# Patient Record
Sex: Female | Born: 1945 | Race: White | Hispanic: No | Marital: Married | Smoking: Former smoker
Health system: Southern US, Community
[De-identification: ages and names within clinical notes are randomized; demographics above are authoritative.]

## PROBLEM LIST (undated history)

## (undated) DIAGNOSIS — Z8719 Personal history of other diseases of the digestive system: Secondary | ICD-10-CM

## (undated) DIAGNOSIS — D649 Anemia, unspecified: Secondary | ICD-10-CM

## (undated) DIAGNOSIS — E119 Type 2 diabetes mellitus without complications: Secondary | ICD-10-CM

## (undated) DIAGNOSIS — T4145XA Adverse effect of unspecified anesthetic, initial encounter: Secondary | ICD-10-CM

## (undated) DIAGNOSIS — E785 Hyperlipidemia, unspecified: Secondary | ICD-10-CM

## (undated) DIAGNOSIS — M1990 Unspecified osteoarthritis, unspecified site: Secondary | ICD-10-CM

## (undated) DIAGNOSIS — Z8619 Personal history of other infectious and parasitic diseases: Secondary | ICD-10-CM

## (undated) DIAGNOSIS — Z9889 Other specified postprocedural states: Secondary | ICD-10-CM

## (undated) DIAGNOSIS — R002 Palpitations: Secondary | ICD-10-CM

## (undated) DIAGNOSIS — D494 Neoplasm of unspecified behavior of bladder: Secondary | ICD-10-CM

## (undated) DIAGNOSIS — Z7982 Long term (current) use of aspirin: Secondary | ICD-10-CM

## (undated) DIAGNOSIS — T8859XA Other complications of anesthesia, initial encounter: Secondary | ICD-10-CM

## (undated) DIAGNOSIS — R112 Nausea with vomiting, unspecified: Secondary | ICD-10-CM

## (undated) DIAGNOSIS — I2510 Atherosclerotic heart disease of native coronary artery without angina pectoris: Secondary | ICD-10-CM

## (undated) DIAGNOSIS — M51369 Other intervertebral disc degeneration, lumbar region without mention of lumbar back pain or lower extremity pain: Secondary | ICD-10-CM

## (undated) DIAGNOSIS — C801 Malignant (primary) neoplasm, unspecified: Secondary | ICD-10-CM

## (undated) DIAGNOSIS — M8580 Other specified disorders of bone density and structure, unspecified site: Secondary | ICD-10-CM

## (undated) DIAGNOSIS — G4762 Sleep related leg cramps: Secondary | ICD-10-CM

## (undated) DIAGNOSIS — C50919 Malignant neoplasm of unspecified site of unspecified female breast: Secondary | ICD-10-CM

## (undated) DIAGNOSIS — Z923 Personal history of irradiation: Secondary | ICD-10-CM

## (undated) DIAGNOSIS — I10 Essential (primary) hypertension: Secondary | ICD-10-CM

## (undated) DIAGNOSIS — F5104 Psychophysiologic insomnia: Secondary | ICD-10-CM

## (undated) DIAGNOSIS — K219 Gastro-esophageal reflux disease without esophagitis: Secondary | ICD-10-CM

## (undated) DIAGNOSIS — T753XXA Motion sickness, initial encounter: Secondary | ICD-10-CM

## (undated) DIAGNOSIS — I700 Atherosclerosis of aorta: Secondary | ICD-10-CM

## (undated) DIAGNOSIS — E559 Vitamin D deficiency, unspecified: Secondary | ICD-10-CM

## (undated) DIAGNOSIS — K5790 Diverticulosis of intestine, part unspecified, without perforation or abscess without bleeding: Secondary | ICD-10-CM

## (undated) DIAGNOSIS — E538 Deficiency of other specified B group vitamins: Secondary | ICD-10-CM

## (undated) DIAGNOSIS — I4510 Unspecified right bundle-branch block: Secondary | ICD-10-CM

## (undated) DIAGNOSIS — F419 Anxiety disorder, unspecified: Secondary | ICD-10-CM

## (undated) HISTORY — PX: BREAST LUMPECTOMY W/ NEEDLE LOCALIZATION: SHX1266

## (undated) HISTORY — PX: APPENDECTOMY: SHX54

## (undated) HISTORY — PX: KNEE ARTHROSCOPY: SUR90

## (undated) HISTORY — PX: ABDOMINAL HYSTERECTOMY: SHX81

## (undated) HISTORY — PX: ESOPHAGOGASTRODUODENOSCOPY: SHX1529

## (undated) HISTORY — PX: BREAST SURGERY: SHX581

## (undated) HISTORY — PX: COLONOSCOPY: SHX174

---

## 1978-02-02 HISTORY — PX: AUGMENTATION MAMMAPLASTY: SUR837

## 1998-05-16 ENCOUNTER — Other Ambulatory Visit: Admission: RE | Admit: 1998-05-16 | Discharge: 1998-05-16 | Payer: Self-pay | Admitting: Obstetrics and Gynecology

## 1999-06-17 ENCOUNTER — Other Ambulatory Visit: Admission: RE | Admit: 1999-06-17 | Discharge: 1999-06-17 | Payer: Self-pay | Admitting: Obstetrics and Gynecology

## 1999-09-15 ENCOUNTER — Other Ambulatory Visit: Admission: RE | Admit: 1999-09-15 | Discharge: 1999-09-15 | Payer: Self-pay | Admitting: Obstetrics and Gynecology

## 2000-08-06 ENCOUNTER — Other Ambulatory Visit: Admission: RE | Admit: 2000-08-06 | Discharge: 2000-08-06 | Payer: Self-pay | Admitting: Obstetrics and Gynecology

## 2001-02-04 ENCOUNTER — Other Ambulatory Visit: Admission: RE | Admit: 2001-02-04 | Discharge: 2001-02-04 | Payer: Self-pay | Admitting: Obstetrics and Gynecology

## 2001-11-22 ENCOUNTER — Other Ambulatory Visit: Admission: RE | Admit: 2001-11-22 | Discharge: 2001-11-22 | Payer: Self-pay | Admitting: Obstetrics and Gynecology

## 2005-04-27 ENCOUNTER — Other Ambulatory Visit: Admission: RE | Admit: 2005-04-27 | Discharge: 2005-04-27 | Payer: Self-pay | Admitting: Obstetrics and Gynecology

## 2005-08-19 ENCOUNTER — Ambulatory Visit: Payer: Self-pay | Admitting: Internal Medicine

## 2006-07-29 ENCOUNTER — Ambulatory Visit: Payer: Self-pay | Admitting: Gastroenterology

## 2006-08-23 ENCOUNTER — Ambulatory Visit: Payer: Self-pay | Admitting: Internal Medicine

## 2007-08-25 ENCOUNTER — Ambulatory Visit: Payer: Self-pay | Admitting: Internal Medicine

## 2008-01-13 ENCOUNTER — Ambulatory Visit: Payer: Self-pay | Admitting: Internal Medicine

## 2008-01-30 ENCOUNTER — Ambulatory Visit: Payer: Self-pay | Admitting: Orthopedic Surgery

## 2008-01-31 ENCOUNTER — Ambulatory Visit: Payer: Self-pay | Admitting: Orthopedic Surgery

## 2008-10-15 ENCOUNTER — Ambulatory Visit: Payer: Self-pay | Admitting: Internal Medicine

## 2009-10-16 ENCOUNTER — Ambulatory Visit: Payer: Self-pay | Admitting: Internal Medicine

## 2010-10-20 ENCOUNTER — Ambulatory Visit: Payer: Self-pay | Admitting: Internal Medicine

## 2011-02-03 DIAGNOSIS — C50919 Malignant neoplasm of unspecified site of unspecified female breast: Secondary | ICD-10-CM

## 2011-02-03 HISTORY — PX: BREAST BIOPSY: SHX20

## 2011-02-03 HISTORY — DX: Malignant neoplasm of unspecified site of unspecified female breast: C50.919

## 2011-02-03 HISTORY — PX: BREAST LUMPECTOMY: SHX2

## 2011-10-21 ENCOUNTER — Ambulatory Visit: Payer: Self-pay | Admitting: Internal Medicine

## 2011-10-23 ENCOUNTER — Ambulatory Visit: Payer: Self-pay | Admitting: Internal Medicine

## 2011-12-03 ENCOUNTER — Ambulatory Visit: Payer: Self-pay | Admitting: Surgery

## 2011-12-10 ENCOUNTER — Ambulatory Visit: Payer: Self-pay | Admitting: Surgery

## 2011-12-10 DIAGNOSIS — D0512 Intraductal carcinoma in situ of left breast: Secondary | ICD-10-CM

## 2011-12-10 HISTORY — DX: Intraductal carcinoma in situ of left breast: D05.12

## 2011-12-21 ENCOUNTER — Ambulatory Visit: Payer: Self-pay | Admitting: Oncology

## 2011-12-23 ENCOUNTER — Ambulatory Visit: Payer: Self-pay | Admitting: Oncology

## 2012-01-03 ENCOUNTER — Ambulatory Visit: Payer: Self-pay | Admitting: Oncology

## 2012-01-15 LAB — CBC CANCER CENTER
Basophil %: 2.4 %
Eosinophil %: 2.9 %
HGB: 13.4 g/dL (ref 12.0–16.0)
Lymphocyte %: 24.7 %
MCH: 28.5 pg (ref 26.0–34.0)
MCV: 83 fL (ref 80–100)
Monocyte #: 0.4 x10 3/mm (ref 0.2–0.9)
Neutrophil %: 62.1 %
RBC: 4.71 10*6/uL (ref 3.80–5.20)

## 2012-01-22 LAB — CBC CANCER CENTER
Basophil #: 0 x10 3/mm (ref 0.0–0.1)
Basophil %: 0.4 %
Eosinophil #: 0.1 x10 3/mm (ref 0.0–0.7)
Eosinophil %: 2.3 %
HCT: 39.2 % (ref 35.0–47.0)
HGB: 13.5 g/dL (ref 12.0–16.0)
MCHC: 34.5 g/dL (ref 32.0–36.0)
MCV: 83 fL (ref 80–100)
Monocyte #: 0.4 x10 3/mm (ref 0.2–0.9)
Neutrophil %: 68.3 %
Platelet: 206 x10 3/mm (ref 150–440)
RBC: 4.74 10*6/uL (ref 3.80–5.20)
WBC: 5.2 x10 3/mm (ref 3.6–11.0)

## 2012-01-29 LAB — CBC CANCER CENTER
Basophil %: 1.2 %
Eosinophil #: 0.2 x10 3/mm (ref 0.0–0.7)
Eosinophil %: 2.8 %
Lymphocyte #: 0.6 x10 3/mm — ABNORMAL LOW (ref 1.0–3.6)
MCH: 28.2 pg (ref 26.0–34.0)
MCHC: 33.9 g/dL (ref 32.0–36.0)
MCV: 83 fL (ref 80–100)
Monocyte #: 0.5 x10 3/mm (ref 0.2–0.9)
Monocyte %: 8.2 %
Neutrophil %: 78.3 %
Platelet: 176 x10 3/mm (ref 150–440)
RBC: 4.64 10*6/uL (ref 3.80–5.20)
RDW: 14.6 % — ABNORMAL HIGH (ref 11.5–14.5)

## 2012-02-03 ENCOUNTER — Ambulatory Visit: Payer: Self-pay | Admitting: Oncology

## 2012-02-05 LAB — CBC CANCER CENTER
Basophil #: 0 x10 3/mm (ref 0.0–0.1)
Basophil %: 0.2 %
Eosinophil #: 0.2 x10 3/mm (ref 0.0–0.7)
Eosinophil %: 3.4 %
HCT: 39.3 % (ref 35.0–47.0)
Lymphocyte %: 18.2 %
MCH: 28.7 pg (ref 26.0–34.0)
MCV: 84 fL (ref 80–100)
Monocyte #: 0.4 x10 3/mm (ref 0.2–0.9)
Neutrophil %: 70.8 %

## 2012-02-12 LAB — CBC CANCER CENTER
Basophil #: 0.1 x10 3/mm (ref 0.0–0.1)
Basophil %: 2.3 %
HCT: 39.8 % (ref 35.0–47.0)
HGB: 13.1 g/dL (ref 12.0–16.0)
Lymphocyte #: 0.8 x10 3/mm — ABNORMAL LOW (ref 1.0–3.6)
MCHC: 33 g/dL (ref 32.0–36.0)
MCV: 83 fL (ref 80–100)
Neutrophil #: 3.1 x10 3/mm (ref 1.4–6.5)
Neutrophil %: 69 %
RDW: 14.4 % (ref 11.5–14.5)

## 2012-02-19 LAB — CBC CANCER CENTER
Basophil %: 0.1 %
Eosinophil #: 0.2 x10 3/mm (ref 0.0–0.7)
HGB: 13.6 g/dL (ref 12.0–16.0)
Lymphocyte #: 0.8 x10 3/mm — ABNORMAL LOW (ref 1.0–3.6)
MCH: 28.4 pg (ref 26.0–34.0)
MCV: 83 fL (ref 80–100)
Monocyte %: 9.9 %
Neutrophil #: 3.3 x10 3/mm (ref 1.4–6.5)
Neutrophil %: 70 %
WBC: 4.7 x10 3/mm (ref 3.6–11.0)

## 2012-03-05 ENCOUNTER — Ambulatory Visit: Payer: Self-pay | Admitting: Oncology

## 2012-03-17 ENCOUNTER — Ambulatory Visit: Payer: Self-pay | Admitting: Obstetrics and Gynecology

## 2012-04-02 ENCOUNTER — Ambulatory Visit: Payer: Self-pay | Admitting: Oncology

## 2012-05-14 ENCOUNTER — Emergency Department: Payer: Self-pay | Admitting: Emergency Medicine

## 2012-05-14 LAB — COMPREHENSIVE METABOLIC PANEL
Alkaline Phosphatase: 72 U/L (ref 50–136)
BUN: 16 mg/dL (ref 7–18)
Bilirubin,Total: 0.3 mg/dL (ref 0.2–1.0)
Calcium, Total: 7.9 mg/dL — ABNORMAL LOW (ref 8.5–10.1)
Creatinine: 1 mg/dL (ref 0.60–1.30)
EGFR (African American): >60
EGFR (Non-African Amer.): 57 — ABNORMAL LOW
Glucose: 138 mg/dL — ABNORMAL HIGH (ref 65–99)
Potassium: 3 mmol/L — ABNORMAL LOW (ref 3.5–5.1)
SGPT (ALT): 22 U/L (ref 12–78)
Total Protein: 6.6 g/dL (ref 6.4–8.2)

## 2012-05-14 LAB — CBC
HCT: 45.5 % (ref 35.0–47.0)
HGB: 15.5 g/dL (ref 12.0–16.0)
MCHC: 34 g/dL (ref 32.0–36.0)
MCV: 84 fL (ref 80–100)
Platelet: 248 10*3/uL (ref 150–440)
WBC: 9.3 10*3/uL (ref 3.6–11.0)

## 2012-05-14 LAB — PRO B NATRIURETIC PEPTIDE: B-Type Natriuretic Peptide: 135 pg/mL — ABNORMAL HIGH (ref 0–125)

## 2012-06-22 ENCOUNTER — Ambulatory Visit: Payer: Self-pay | Admitting: Oncology

## 2012-07-03 ENCOUNTER — Ambulatory Visit: Payer: Self-pay | Admitting: Oncology

## 2012-09-06 ENCOUNTER — Ambulatory Visit: Payer: Self-pay | Admitting: Radiation Oncology

## 2012-10-03 ENCOUNTER — Ambulatory Visit: Payer: Self-pay | Admitting: Radiation Oncology

## 2012-11-02 ENCOUNTER — Ambulatory Visit: Payer: Self-pay | Admitting: Radiation Oncology

## 2013-03-06 ENCOUNTER — Ambulatory Visit: Payer: Self-pay | Admitting: Oncology

## 2013-04-02 ENCOUNTER — Ambulatory Visit: Payer: Self-pay | Admitting: Oncology

## 2013-05-03 ENCOUNTER — Ambulatory Visit: Payer: Self-pay | Admitting: Oncology

## 2013-06-09 ENCOUNTER — Ambulatory Visit: Payer: Self-pay | Admitting: Internal Medicine

## 2013-07-12 DIAGNOSIS — R002 Palpitations: Secondary | ICD-10-CM | POA: Insufficient documentation

## 2013-07-12 DIAGNOSIS — F5104 Psychophysiologic insomnia: Secondary | ICD-10-CM | POA: Insufficient documentation

## 2013-07-12 DIAGNOSIS — E559 Vitamin D deficiency, unspecified: Secondary | ICD-10-CM | POA: Insufficient documentation

## 2013-07-12 DIAGNOSIS — C50919 Malignant neoplasm of unspecified site of unspecified female breast: Secondary | ICD-10-CM | POA: Insufficient documentation

## 2013-07-12 DIAGNOSIS — E785 Hyperlipidemia, unspecified: Secondary | ICD-10-CM | POA: Insufficient documentation

## 2013-07-12 DIAGNOSIS — D649 Anemia, unspecified: Secondary | ICD-10-CM | POA: Insufficient documentation

## 2013-07-12 DIAGNOSIS — I10 Essential (primary) hypertension: Secondary | ICD-10-CM | POA: Insufficient documentation

## 2013-07-12 DIAGNOSIS — M8580 Other specified disorders of bone density and structure, unspecified site: Secondary | ICD-10-CM | POA: Insufficient documentation

## 2013-10-27 ENCOUNTER — Ambulatory Visit: Payer: Self-pay | Admitting: Oncology

## 2013-11-02 ENCOUNTER — Ambulatory Visit: Payer: Self-pay | Admitting: Oncology

## 2013-11-14 ENCOUNTER — Ambulatory Visit: Payer: Self-pay | Admitting: Internal Medicine

## 2014-03-08 ENCOUNTER — Ambulatory Visit: Payer: Self-pay | Admitting: Oncology

## 2014-04-03 ENCOUNTER — Ambulatory Visit
Admit: 2014-04-03 | Disposition: A | Discharge status: Home / Self Care | Payer: Self-pay | Attending: Oncology | Admitting: Oncology

## 2014-04-17 ENCOUNTER — Observation Stay: Payer: Self-pay | Admitting: Internal Medicine

## 2014-05-04 ENCOUNTER — Ambulatory Visit
Admit: 2014-05-04 | Disposition: A | Discharge status: Home / Self Care | Payer: Self-pay | Attending: Oncology | Admitting: Oncology

## 2014-05-18 ENCOUNTER — Other Ambulatory Visit: Payer: Self-pay | Admitting: Oncology

## 2014-05-18 DIAGNOSIS — Z853 Personal history of malignant neoplasm of breast: Secondary | ICD-10-CM

## 2014-05-22 NOTE — Op Note (Signed)
PATIENT NAME:  Leslie Love, Leslie Love MR#:  [id number] DATE OF BIRTH:  09-06-1945  DATE OF PROCEDURE:  12/10/2011  PREOPERATIVE DIAGNOSIS: Left breast mass.   POSTOPERATIVE DIAGNOSIS: Left breast mass.   PROCEDURE: Excision of left breast mass.   SURGEON: Loreli Dollar, MD    ANESTHESIA: Local 0.5% Sensorcaine with epinephrine, monitored anesthesia care.   INDICATIONS: This 69 year old female recently had an abnormal mammogram which demonstrated a density in the upper outer aspect of the left breast in the retroareolar area very close to the nipple. Ultrasound demonstrated shadowing density some 8 mm in dimension which was superficial. Excision was recommended for definitive treatment. The patient had a preoperative ultrasound-guided insertion of Kopans wire.  PROCEDURE: The patient was placed on the operating table in the supine position under intravenous sedation. The dressing was removed from the left breast exposing the Kopans wire which entered the breast several centimeters away from the areola in the lower outer quadrant. The wire was cut 2 cm from the skin. Ultrasound was used to view the location of the wire near the nipple. Subsequently, the breast was prepared with ChloraPrep and draped in a sterile manner. The skin of the areola was infiltrated with 0.5% Sensorcaine with epinephrine and allowed some time for the anesthesia to take effect. Next, a curvilinear incision was made from approximately 1 o'clock to 5 o'clock position close to the nipple. An ellipse of skin was excised with the underlying specimen. Dissection was carried down to encounter the wire which was delivered up into the operative field and a sample of tissue surrounding the wire was excised. There was some minimal degree of firmness in the tissue and was submitted for routine pathology. The wound was inspected. There was no remaining mass within the wound, just subcutaneous fatty tissues. Several small bleeding points were  cauterized. Next, the wound was closed with a running 5-0 Monocryl subcuticular suture and Dermabond.     The patient tolerated surgery satisfactorily and was then prepared for transfer to the recovery room.   ____________________________ Lenna Sciara. Rochel Brome, MD jws:drc D: 12/10/2011 10:45:08 ET T: 12/10/2011 12:20:38 ET JOB#: [id number]  cc: Loreli Dollar, MD, <Dictator> Loreli Dollar MD ELECTRONICALLY SIGNED 12/10/2011 13:03

## 2014-05-22 NOTE — Consult Note (Signed)
Reason for Visit: This 69 year old Female patient presents to the clinic for initial evaluation of  Breast cancer .   Referred by Dr. Grayland Ormond.  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   Pathology Report Pathology report reviewed    Imaging Report Mammograms ultrasound reviewed    Referral Report Clinical notes reviewed    Planned Treatment Regimen Adjuvant radiation therapy to left breast    HPI   patient is a 69 year old female who presented with an abnormal mammogram showing an 8 mm focus at the 2 to 3:00 position demonstrating irregulart hypoechoic regionof the left breast. She underwent an excisional biopsy which was positive for a 0.2 cm area of grade 2 ductal carcinoma in situ. Lesion was so small ER/PR status was not able to be performed on an block. Tumor was cribriform with calcifications. Patient had been seen by medical oncology and has been decided to start her on tamoxifen after completion of radiation. She is doing well at the present time. patient does have bilateral breast implants dating back at least 20 years.  Specifically denies breast tenderness cough or bone pain. Seen today for radiation oncology opinion.  Past Hx:    hypertension:    breast cancer: 2013   lumpectomy:   Past, Family and Social History:   Past Medical History positive    Cardiovascular hyperlipidemia; hypertension    Gastrointestinal GERD    Past Surgical History appendectomy; Partial hysterectomy    Family History positive    Family History Comments Family history positive for coronary vascular heart disease thrombophlebitis, sister with leukemia grandmother with breast cancer mother with cervical cancer    Social History positive    Social History Comments No smoking history social EtOH use history   Allergies:   Codeine: N/V  Home Meds:  Home  Medications: Medication Instructions Status  Calcium 500mg  with Vitamin D 400 IU 2 tab(s) orally once a day Active  Co Q-10 200 mg oral capsule  1 cap(s) orally once a day Active  Vitamin B-12 500 mcg oral tablet 1 tab(s) sublingual once a day Active  red yeast rice 600 mg oral capsule 2 cap(s) orally once a day Active  Vitamin D3 2000 intl units oral capsule 1 cap(s) orally once a day Active  omeprazole 20 mg oral delayed release tablet 1 tab(s) orally once a day (at bedtime) Active  bisoprolol-hydrochlorothiazide 5 mg-6.25 mg oral tablet 1 tab(s) orally once a day Active  pravastatin 40 mg oral tablet 1 tab(s) orally once a day (at bedtime) Active  Dexilant 60 mg oral delayed release capsule 1 cap(s) orally once a day (in the morning) Active   Review of Systems:   General negative    Performance Status (ECOG) 0    Skin negative    Breast see HPI    Ophthalmologic negative    ENMT negative    Respiratory and Thorax negative    Cardiovascular negative    Gastrointestinal negative    Genitourinary negative    Musculoskeletal negative    Neurological negative    Psychiatric negative    Hematology/Lymphatics negative    Endocrine negative    Allergic/Immunologic negative    Review of Systems   according to the nurse's notesPatient denies any weight loss, fatigue, weakness, fever, chills or night sweats. Patient denies any loss of vision, blurred vision. Patient denies any ringing  of  the ears or hearing loss. No irregular heartbeat. Patient denies heart murmur or history of fainting. Patient denies any chest pain or pain radiating to her upper extremities. Patient denies any shortness of breath, difficulty breathing at night, cough or hemoptysis. Patient denies any swelling in the lower legs. Patient denies any nausea vomiting, vomiting of blood, or coffee ground material in the vomitus. Patient denies any stomach pain. Patient states has had normal bowel movements no  significant constipation or diarrhea. Patient denies any dysuria, hematuria or significant nocturia. Patient denies any problems walking, swelling in the joints or loss of balance. Patient denies any skin changes, loss of hair or loss of weight. Patient denies any excessive worrying or anxiety or significant depression. Patient denies any problems with insomnia. Patient denies excessive thirst, polyuria, polydipsia. Patient denies any swollen glands, patient denies easy bruising or easy bleeding. Patient denies any recent infections, allergies or URI. Patient "s visual fields have not changed significantly in recent time.  Nursing Notes:  Nursing Vital Signs and Chemo Nursing Nursing Notes: *CC Vital Signs Flowsheet:   25-Nov-13 13:20   Temp Temperature 98.4   Pulse Pulse 65   Respirations Respirations 20   SBP SBP 127   DBP DBP 76   Pain Scale (0-10)  0   Current Weight (kg) (kg) 78.5   Height (cm) centimeters 162.5   BSA (m2) 1.8   Physical Exam:  General/Skin/HEENT:   General normal    Skin normal    Eyes normal    ENMT normal    Head and Neck normal    Additional PE Well-developed well-nourished female in NAD. Left breast is a wide local excision site in the nipple area looks complex which is healed well. Patient has bilateral breast implants. No dominant mass or nodularity is noted in either breast into position examined. Lungs are clear to A&P cardiac examination shows regular rate and rhythm. Abdomen is benign.   Breasts/Resp/CV/GI/GU:   Respiratory and Thorax normal    Cardiovascular normal    Gastrointestinal normal    Genitourinary normal   MS/Neuro/Psych/Lymph:   Musculoskeletal normal    Neurological normal    Lymphatics normal   Assessment and Plan:  Impression:   stage 0 ductal carcinoma in situ in 69 year old female unable to determine ER/PR status for adjuvant radiation therapy to left breast.  Plan:   at this time I recommended going ahead with  radiation therapy to her left breast. Would treat to 5000 cGy over 5 weeks and boost or scar another 1400 cGy. Risks and benefits of treatment including skin reaction, inclusion of superficial lung, fatigue, alteration blood counts, and possible contraction around her breast implant were all explained in detail to the patient. She seems to comprehend my treatment plan well. I discussed the case personally with Dr. Grayland Ormond and she will be a candidate for tamoxifen after completion of radiation. I have set her up for CT simulation next week.  I would like to take this opportunity to thank you for allowing me to continue to participate in this patient's care.  CC Referral:   cc: Dr. Fulton Reek, Dr. Tamala Julian   Electronic Signatures: Baruch Gouty, Roda Shutters (MD)  (Signed [phone] 12:23)  Authored: HPI, Diagnosis, Past Hx, PFSH, Allergies, Home Meds, ROS, Nursing Notes, Physical Exam, Encounter Assessment and Plan, CC Referring Physician   Last Updated: 27-Nov-13 12:23 by Armstead Peaks (MD)

## 2014-06-03 NOTE — H&P (Signed)
PATIENT NAME:  Leslie Love, ALPERIN MR#:  [id number] DATE OF BIRTH:  October 04, 1945  DATE OF ADMISSION:  04/17/2014  REFERRING PHYSICIAN:  Valli Glance. Owens Shark, MD   PRIMARY CARE PHYSICIAN:  Leonie Douglas. Doy Hutching, MD, of [place]:  Chest pain.   HISTORY OF PRESENT ILLNESS:  This is a 69 year old Caucasian female with past medical history of essential hypertension; hyperlipidemia, unspecified; and cancer of left breast, DCIS, status post radiation therapy, presenting with chest pain. She describes acute onset of chest pain, which occurred at rest, retrosternal in location, pressure in quality, 8 to 9 out of 10 in intensity with radiation to left arm and back with associated shortness of breath and nausea without emesis, as well as diaphoresis. She is now pain-free on presentation to the Emergency Department.   REVIEW OF SYSTEMS: CONSTITUTIONAL:  Denies fevers, chills, fatigue, or weakness.  EYES:  Denies blurred vision, double vision, or eye pain.  EARS, NOSE, AND THROAT:  Denies tinnitus, ear pain, or hearing loss. RESPIRATORY:  Denies cough or wheeze. Positive for shortness of breath as described above.  CARDIOVASCULAR:  Positive for chest pain as described above. Denies palpitations or edema.  GASTROINTESTINAL:  Positive for nausea as described above. Denies diarrhea or constipation.  GENITOURINARY:  Denies dysuria or hematuria.  ENDOCRINE:  Denies nocturia or thyroid problems.  HEMATOLOGIC AND LYMPHATIC:  Denies easy bruising or bleeding.  SKIN:  Denies rash or lesion.  MUSCULOSKELETAL:  Denies pain in the neck, back, shoulders, knees, or hips or arthritic symptoms.  NEUROLOGIC:  Denies paralysis or paresthesias.  PSYCHIATRIC:  Denies anxiety or depressive symptoms.   Otherwise, full review of systems performed by me is negative.   PAST MEDICAL HISTORY:  Includes hypertension, essential; hyperlipidemia, unspecified; and breast cancer, DCIS of the left breast.   SOCIAL  HISTORY:  Denies any tobacco use. Positive for occasional alcohol use. No drug use.   FAMILY HISTORY:  Positive for coronary artery disease.   ALLERGIES:  CODEINE.   HOME MEDICATIONS:  Include aspirin 81 mg p.o. daily, pravastatin 40 mg p.o. at bedtime, bisoprolol/hydrochlorothiazide 5/6.25 mg p.o. daily, tamoxifen 20 mg p.o. daily, red yeast rice 600 mg p.o. daily, Prilosec 20 mg p.o. b.i.d., vitamin B12 at 500 mcg daily, vitamin D3 at 2000 international units daily, calcium 500 plus vitamin D 400 two tablets daily, CoQ10 at 100 mg p.o. daily, magnesium 400 mg p.o. daily.   PHYSICAL EXAMINATION: VITAL SIGNS:  Temperature 97.8, heart rate 75, respirations 16, blood pressure 112/55, saturating 97% on room air. Weight 80.7 kg, BMI 30.6.  GENERAL:  Well-nourished, well-developed, Caucasian female currently in no acute distress.  HEAD:  Normocephalic, atraumatic.  EYES:  Pupils are equal, round, and reactive to light. Extraocular muscles are intact. No scleral icterus.  MOUTH:  Moist mucosal membrane. Dentition is intact. No abscess noted.  EARS, NOSE, AND THROAT:  Clear without exudate. No external lesions.  NECK:  Supple. No thyromegaly. No nodules. No JVD.  PULMONARY:  Clear to auscultation bilaterally without wheezes, rales, or rhonchi. No use of accessory muscles. Good respiratory effort.  CHEST:  Nontender to palpation.  CARDIOVASCULAR:  S1 and S2. Regular rate and rhythm. No murmurs, rubs, or gallops. No edema. Pedal pulses are 2+ bilaterally.  GASTROINTESTINAL:  Soft, nontender, nondistended. No masses. Positive bowel sounds. No hepatosplenomegaly.  MUSCULOSKELETAL:  No swelling, clubbing, or edema. Range of motion is full in all extremities.  NEUROLOGIC:  Cranial nerves II through XII are intact. No  gross focal neurological deficits. Sensation is intact. Reflexes are intact.  SKIN:  No ulceration, lesions, rashes, or cyanosis. Skin is warm and dry. Turgor is intact.  PSYCHIATRIC:  Mood  and affect are within normal limits. The patient is awake, alert, and oriented x 3. Insight and judgment are intact.  LABORATORY DATA:  EKG performed shows normal sinus rhythm. No ST or T wave abnormality. Sodium is 139, potassium 3, chloride 101, bicarbonate 26, BUN 14, creatinine 0.85, glucose 160. Troponin is less than 0.03. WBC is 5.9, hemoglobin 12.9, and platelets are 208,000. Chest x-ray performed, which reveals no acute cardiopulmonary process. CT of the chest performed, which reveals a 9 mm, ground-glass nodule at the medial right lung base.   ASSESSMENT AND PLAN:  A 69 year old Caucasian female with a history of essential hypertension and hyperlipidemia, unspecified, presenting with acute onset of chest pain retrosternal in location.   1. Chest pain, central in location. Initiate aspirin and statin therapy. Place on telemetry. Trend cardiac enzymes x 3.  2.  Hypertension, essential. Continue with home medication bisoprolol/hydrochlorothiazide.  3.  Hypokalemia. Replace potassium to goal of 4 to 5.  4.  A 9 mm lung nodule. Need to follow up chest CT in 3 months as an outpatient.  5.  Gastroesophageal reflux disease without esophagitis. Proton pump inhibitor therapy.  6.  Venous thromboembolism prophylaxis with heparin subcutaneously.   CODE STATUS:  The patient is a full code.   TIME SPENT:  45 minutes.    ____________________________ Aaron Mose. Horacio Werth, MD dkh:nb D: 04/17/2014 02:47:33 ET T: 04/17/2014 03:20:40 ET JOB#: [id number]  cc: Aaron Mose. Christan Ciccarelli, MD, <Dictator> Trevar Boehringer Woodfin Ganja MD ELECTRONICALLY SIGNED 04/17/2014 20:46

## 2014-07-27 ENCOUNTER — Ambulatory Visit: Payer: Medicare Other

## 2014-07-27 ENCOUNTER — Other Ambulatory Visit: Payer: Self-pay

## 2014-07-30 ENCOUNTER — Ambulatory Visit: Payer: Self-pay | Admitting: Oncology

## 2014-07-31 ENCOUNTER — Ambulatory Visit: Payer: Self-pay | Admitting: Oncology

## 2014-08-08 ENCOUNTER — Ambulatory Visit
Admission: RE | Admit: 2014-08-08 | Discharge: 2014-08-08 | Disposition: A | Discharge status: Home / Self Care | Payer: Medicare Other | Source: Ambulatory Visit | Attending: Oncology | Admitting: Oncology

## 2014-08-08 DIAGNOSIS — R918 Other nonspecific abnormal finding of lung field: Secondary | ICD-10-CM | POA: Insufficient documentation

## 2014-08-08 DIAGNOSIS — Z853 Personal history of malignant neoplasm of breast: Secondary | ICD-10-CM | POA: Diagnosis present

## 2014-08-08 DIAGNOSIS — I2510 Atherosclerotic heart disease of native coronary artery without angina pectoris: Secondary | ICD-10-CM | POA: Insufficient documentation

## 2014-08-08 HISTORY — DX: Malignant (primary) neoplasm, unspecified: C80.1

## 2014-08-08 HISTORY — DX: Essential (primary) hypertension: I10

## 2014-08-08 MED ORDER — IOHEXOL 300 MG/ML  SOLN
75.0000 mL | Freq: Once | INTRAMUSCULAR | Status: AC | PRN
  Administered 2014-08-08: 75 mL via INTRAVENOUS

## 2014-08-09 ENCOUNTER — Inpatient Hospital Stay: Payer: Medicare Other | Attending: Oncology | Admitting: Oncology

## 2014-08-09 VITALS — BP 141/73 | HR 96 | Temp 98.4°F | Resp 18 | Wt 182.5 lb

## 2014-08-09 DIAGNOSIS — Z923 Personal history of irradiation: Secondary | ICD-10-CM | POA: Diagnosis not present

## 2014-08-09 DIAGNOSIS — Z170 Estrogen receptor positive status [ER+]: Secondary | ICD-10-CM | POA: Insufficient documentation

## 2014-08-09 DIAGNOSIS — Z79810 Long term (current) use of selective estrogen receptor modulators (SERMs): Secondary | ICD-10-CM | POA: Diagnosis not present

## 2014-08-09 DIAGNOSIS — Z90710 Acquired absence of both cervix and uterus: Secondary | ICD-10-CM | POA: Diagnosis not present

## 2014-08-09 DIAGNOSIS — R918 Other nonspecific abnormal finding of lung field: Secondary | ICD-10-CM | POA: Insufficient documentation

## 2014-08-09 DIAGNOSIS — Z79899 Other long term (current) drug therapy: Secondary | ICD-10-CM

## 2014-08-09 DIAGNOSIS — Z7982 Long term (current) use of aspirin: Secondary | ICD-10-CM | POA: Insufficient documentation

## 2014-08-09 DIAGNOSIS — D0510 Intraductal carcinoma in situ of unspecified breast: Secondary | ICD-10-CM

## 2014-08-09 DIAGNOSIS — D0592 Unspecified type of carcinoma in situ of left breast: Secondary | ICD-10-CM

## 2014-08-09 DIAGNOSIS — R232 Flushing: Secondary | ICD-10-CM | POA: Diagnosis not present

## 2014-08-09 DIAGNOSIS — R911 Solitary pulmonary nodule: Secondary | ICD-10-CM

## 2014-08-15 ENCOUNTER — Ambulatory Visit: Payer: Medicare Other

## 2014-08-27 NOTE — Progress Notes (Signed)
[place]  Telephone:(336) [phone] Fax:(336) [phone]  ID: MARIBELL DEMEO OB: 1946-01-06  MR#: [id number]  RFX#:[id number]  Patient Care Team: Idelle Crouch, MD as PCP - General (Internal Medicine)  CHIEF COMPLAINT:  Chief Complaint  Patient presents with  . Follow-up    DCIS/lung nodules    INTERVAL HISTORY: Patient returns to clinic today for further evaluation and discussion of her imaging results. She continues to tolerate tamoxifen well, but still complains of occasional hot flashes.  She denies any pain.  She has no neurologic complaints.  She denies any recent fevers.  She has a good appetite and denies weight loss.  She has no chest pain or shortness of breath.  She denies any nausea, vomiting, constipation, or diarrhea.  She has no urinary complaints.  Patient offers no further specific complaints today.  REVIEW OF SYSTEMS:   Review of Systems  Constitutional: Negative.   Respiratory: Negative.   Cardiovascular: Negative.     As per HPI. Otherwise, a complete review of systems is negatve.  PAST MEDICAL HISTORY: Past Medical History  Diagnosis Date  . Hypertension   . Cancer     Left breast cancer 2014, lumpectomy    PAST SURGICAL HISTORY: Left lumpectomy, partial hysterectomy, appendectomy.  FAMILY HISTORY: Blood clots, diabetes, heart disease, sister died > 27 years ago of leukemia, grandmother with breast cancer, mother with cervical cancer.     ADVANCED DIRECTIVES:    HEALTH MAINTENANCE: History  Substance Use Topics  . Smoking status: Not on file  . Smokeless tobacco: Not on file  . Alcohol Use: Not on file     Colonoscopy:  PAP:  Bone density:  Lipid panel:  Allergies  Allergen Reactions  . Codeine Nausea And Vomiting    Current Outpatient Prescriptions  Medication Sig Dispense Refill  . ALPRAZolam (XANAX) 0.25 MG tablet Take 0.25 mg by mouth every 8 (eight) hours.  5  . aspirin EC 81 MG tablet Take by mouth.      . bisoprolol-hydrochlorothiazide (ZIAC) 5-6.25 MG per tablet     . Calcium Carbonate-Vitamin D (CALCIUM + D PO) Take by mouth.    . Coenzyme Q10 (CO Q-10) 100 MG CAPS Take by mouth.    Marland Kitchen omeprazole (PRILOSEC) 20 MG capsule     . pravastatin (PRAVACHOL) 40 MG tablet     . Red Yeast Rice Extract 600 MG CAPS Take by mouth.    . tamoxifen (NOLVADEX) 20 MG tablet      No current facility-administered medications for this visit.    OBJECTIVE: Filed Vitals:   08/09/14 1654  BP: 141/73  Pulse: 96  Temp: 98.4 F (36.9 C)  Resp: 18     There is no height on file to calculate BMI.    ECOG FS:0 - Asymptomatic  General: Well-developed, well-nourished, no acute distress. Eyes: anicteric sclera. Breasts: Patient requested exam be deferred today. Lungs: Clear to auscultation bilaterally. Heart: Regular rate and rhythm. No rubs, murmurs, or gallops. Abdomen: Soft, nontender, nondistended. No organomegaly noted, normoactive bowel sounds. Musculoskeletal: No edema, cyanosis, or clubbing. Neuro: Alert, answering all questions appropriately. Cranial nerves grossly intact. Skin: No rashes or petechiae noted. Psych: Normal affect.  LAB RESULTS:  Lab Results  Component Value Date   NA 140 05/14/2012   K 3.0* 05/14/2012   CL 106 05/14/2012   CO2 25 05/14/2012   GLUCOSE 138* 05/14/2012   BUN 16 05/14/2012   CREATININE 1.00 05/14/2012   CALCIUM 7.9* 05/14/2012  PROT 6.6 05/14/2012   ALBUMIN 3.7 05/14/2012   AST 26 05/14/2012   ALT 22 05/14/2012   ALKPHOS 72 05/14/2012   BILITOT 0.3 05/14/2012   GFRNONAA 57* 05/14/2012   GFRAA >60 05/14/2012    Lab Results  Component Value Date   WBC 9.3 05/14/2012   NEUTROABS 3.3 02/19/2012   HGB 15.5 05/14/2012   HCT 45.5 05/14/2012   MCV 84 05/14/2012   PLT 248 05/14/2012     STUDIES: Ct Chest W Contrast  08/08/2014   CLINICAL DATA:  History of breast cancer. Followup pulmonary nodules.  EXAM: CT CHEST WITH CONTRAST  TECHNIQUE:  Multidetector CT imaging of the chest was performed during intravenous contrast administration.  CONTRAST:  22mL OMNIPAQUE IOHEXOL 300 MG/ML  SOLN  COMPARISON:  Chest CT 04/17/2014  FINDINGS: Chest wall: Bilateral breast prosthesis are noted. No breast mass, supraclavicular or axillary lymphadenopathy. Stable small thyroid nodules. The bony thorax is intact. No destructive bone lesions or spinal canal compromise.  Mediastinum: The heart is normal in size. No pericardial effusion. The aorta is normal in caliber. No dissection. Stable scattered aortic and branch vessel calcifications including the coronary arteries. No mediastinal or hilar mass or lymphadenopathy. The esophagus is grossly normal.  Lungs/ pleura: There are a few small scattered sub 3 mm pulmonary nodules which appears stable.  Two small nodules in the left upper lobe are noted on image number 11 and image 10.  Small nodule in the right upper lobe on image number 11.  The semi solid/ground-glass nodule in the right lower lobe on image number 38 is stable measuring approximately 10 mm.  Semi-solid nodular density in the right lower lobe on image number 41 measures approximately 7 mm and appears stable. No new lesions are identified.  No acute pulmonary findings.  No pleural effusion.  Upper abdomen: No significant findings. Diffuse fatty infiltration of the liver is noted.  IMPRESSION: 1. Small scattered sub 3 mm pulmonary nodules appears stable. 2. Stable semi-solid/ground-glass nodules in the right lower lobe. Recommend continued surveillance. Follow-up noncontrast chest CT in 6 months is suggested. 3. No mediastinal or hilar mass or adenopathy.   Electronically Signed   By: Marijo Sanes M.D.   On: 08/08/2014 08:48    ASSESSMENT: DCIS, pulmonary nodule.  PLAN:    1.  DCIS: No evidence of disease.  Continue tamoxifen completing in February 2019.  Patient's most recent mammogram in September 2015 was reported as BI-RADS 2, repeat in September  2016.  2. Pulmonary nodule: CT scan results as above and reviewed independently. Will repeat CT scan in 6 months to assess stability and patient will return to clinic 1 to 2 days after for further evaluation.  3. Family history: Patient is negative for BCRA 1 and 2.  Patient expressed understanding and was in agreement with this plan. She also understands that She can call clinic at any time with any questions, concerns, or complaints.   No matching staging information was found for the patient.  Lloyd Huger, MD   08/27/2014 4:01 PM

## 2014-11-14 ENCOUNTER — Other Ambulatory Visit: Payer: Self-pay | Admitting: Internal Medicine

## 2014-11-14 DIAGNOSIS — R1030 Lower abdominal pain, unspecified: Secondary | ICD-10-CM

## 2014-11-14 DIAGNOSIS — R102 Pelvic and perineal pain: Secondary | ICD-10-CM

## 2014-11-20 ENCOUNTER — Ambulatory Visit
Admission: RE | Admit: 2014-11-20 | Discharge: 2014-11-20 | Disposition: A | Discharge status: Home / Self Care | Payer: Medicare Other | Source: Ambulatory Visit | Attending: Internal Medicine | Admitting: Internal Medicine

## 2014-11-20 DIAGNOSIS — R1030 Lower abdominal pain, unspecified: Secondary | ICD-10-CM | POA: Diagnosis not present

## 2014-11-20 DIAGNOSIS — R102 Pelvic and perineal pain: Secondary | ICD-10-CM

## 2014-11-20 MED ORDER — IOHEXOL 300 MG/ML  SOLN
100.0000 mL | Freq: Once | INTRAMUSCULAR | Status: AC | PRN
  Administered 2014-11-20: 100 mL via INTRAVENOUS

## 2014-12-26 ENCOUNTER — Encounter: Payer: Self-pay | Admitting: *Deleted

## 2014-12-31 ENCOUNTER — Ambulatory Visit: Payer: Medicare Other | Admitting: Anesthesiology

## 2014-12-31 ENCOUNTER — Encounter
Admission: RE | Disposition: A | Discharge status: Home / Self Care | Payer: Self-pay | Source: Ambulatory Visit | Attending: Gastroenterology

## 2014-12-31 ENCOUNTER — Ambulatory Visit
Admission: RE | Admit: 2014-12-31 | Discharge: 2014-12-31 | Disposition: A | Discharge status: Home / Self Care | Payer: Medicare Other | Source: Ambulatory Visit | Attending: Gastroenterology | Admitting: Gastroenterology

## 2014-12-31 ENCOUNTER — Encounter: Payer: Self-pay | Admitting: *Deleted

## 2014-12-31 DIAGNOSIS — I10 Essential (primary) hypertension: Secondary | ICD-10-CM | POA: Diagnosis not present

## 2014-12-31 DIAGNOSIS — K529 Noninfective gastroenteritis and colitis, unspecified: Secondary | ICD-10-CM | POA: Diagnosis not present

## 2014-12-31 DIAGNOSIS — E559 Vitamin D deficiency, unspecified: Secondary | ICD-10-CM | POA: Insufficient documentation

## 2014-12-31 DIAGNOSIS — Z8249 Family history of ischemic heart disease and other diseases of the circulatory system: Secondary | ICD-10-CM | POA: Diagnosis not present

## 2014-12-31 DIAGNOSIS — Z1211 Encounter for screening for malignant neoplasm of colon: Secondary | ICD-10-CM | POA: Diagnosis not present

## 2014-12-31 DIAGNOSIS — Z87891 Personal history of nicotine dependence: Secondary | ICD-10-CM | POA: Insufficient documentation

## 2014-12-31 DIAGNOSIS — Z823 Family history of stroke: Secondary | ICD-10-CM | POA: Insufficient documentation

## 2014-12-31 DIAGNOSIS — M8580 Other specified disorders of bone density and structure, unspecified site: Secondary | ICD-10-CM | POA: Diagnosis not present

## 2014-12-31 DIAGNOSIS — Z90710 Acquired absence of both cervix and uterus: Secondary | ICD-10-CM | POA: Diagnosis not present

## 2014-12-31 DIAGNOSIS — K5730 Diverticulosis of large intestine without perforation or abscess without bleeding: Secondary | ICD-10-CM | POA: Insufficient documentation

## 2014-12-31 DIAGNOSIS — E785 Hyperlipidemia, unspecified: Secondary | ICD-10-CM | POA: Diagnosis not present

## 2014-12-31 DIAGNOSIS — R002 Palpitations: Secondary | ICD-10-CM | POA: Diagnosis not present

## 2014-12-31 DIAGNOSIS — Z7982 Long term (current) use of aspirin: Secondary | ICD-10-CM | POA: Diagnosis not present

## 2014-12-31 DIAGNOSIS — Z79899 Other long term (current) drug therapy: Secondary | ICD-10-CM | POA: Diagnosis not present

## 2014-12-31 HISTORY — DX: Sleep related leg cramps: G47.62

## 2014-12-31 HISTORY — DX: Gastro-esophageal reflux disease without esophagitis: K21.9

## 2014-12-31 HISTORY — DX: Psychophysiologic insomnia: F51.04

## 2014-12-31 HISTORY — DX: Palpitations: R00.2

## 2014-12-31 HISTORY — DX: Hyperlipidemia, unspecified: E78.5

## 2014-12-31 HISTORY — DX: Personal history of other diseases of the digestive system: Z87.19

## 2014-12-31 HISTORY — DX: Personal history of other infectious and parasitic diseases: Z86.19

## 2014-12-31 HISTORY — PX: COLONOSCOPY: SHX5424

## 2014-12-31 HISTORY — DX: Vitamin D deficiency, unspecified: E55.9

## 2014-12-31 HISTORY — DX: Other specified disorders of bone density and structure, unspecified site: M85.80

## 2014-12-31 SURGERY — COLONOSCOPY
Anesthesia: General

## 2014-12-31 MED ORDER — SODIUM CHLORIDE 0.9 % IV SOLN
INTRAVENOUS | Status: DC
  Administered 2014-12-31: 1000 mL via INTRAVENOUS

## 2014-12-31 MED ORDER — PROPOFOL 10 MG/ML IV BOLUS
INTRAVENOUS | Status: DC | PRN
  Administered 2014-12-31: 50 mg via INTRAVENOUS
  Administered 2014-12-31: 30 mg via INTRAVENOUS
  Administered 2014-12-31 (×2): 50 mg via INTRAVENOUS

## 2014-12-31 MED ORDER — SODIUM CHLORIDE 0.9 % IV SOLN
INTRAVENOUS | Status: DC
  Administered 2014-12-31: 10:00:00 via INTRAVENOUS

## 2014-12-31 MED ORDER — PROPOFOL 500 MG/50ML IV EMUL
INTRAVENOUS | Status: DC | PRN
  Administered 2014-12-31: 150 ug/kg/min via INTRAVENOUS

## 2014-12-31 NOTE — Anesthesia Postprocedure Evaluation (Signed)
Anesthesia Post Note  Patient: Leslie Love  Procedure(s) Performed: Procedure(s) (LRB): COLONOSCOPY (N/A)  Patient location during evaluation: PACU Anesthesia Type: General Level of consciousness: awake and alert Pain management: pain level controlled Vital Signs Assessment: post-procedure vital signs reviewed and stable Respiratory status: spontaneous breathing Cardiovascular status: stable Anesthetic complications: no    Last Vitals:  Filed Vitals:   12/31/14 0958 12/31/14 1008  BP: 92/52 111/55  Pulse: 81 75  Temp:    Resp: 14 12    Last Pain: There were no vitals filed for this visit.               VAN STAVEREN,Kalel Harty

## 2014-12-31 NOTE — Op Note (Signed)
[facility] Gastroenterology Patient Name: Leslie Love Procedure Date: 12/31/2014 9:32 AM MRN: [id number] Account #: [id number] Date of Birth: 28-Jun-1945 Admit Type: Outpatient Age: 69 Room: [facility] ENDO ROOM 4 Gender: Female Note Status: Finalized Procedure:         Colonoscopy Indications:       Screening for colorectal malignant neoplasm, Hx of                     diverticulitis Providers:         Lupita Dawn. Candace Cruise, MD Referring MD:      Kimberlee Nearing. Alvin Critchley, MD (Referring MD) Medicines:         Monitored Anesthesia Care Complications:     No immediate complications. Procedure:         Pre-Anesthesia Assessment:                    - Prior to the procedure, a History and Physical was                     performed, and patient medications, allergies and                     sensitivities were reviewed. The patient's tolerance of                     previous anesthesia was reviewed.                    - The risks and benefits of the procedure and the sedation                     options and risks were discussed with the patient. All                     questions were answered and informed consent was obtained.                    - After reviewing the risks and benefits, the patient was                     deemed in satisfactory condition to undergo the procedure.                    After obtaining informed consent, the colonoscope was                     passed under direct vision. Throughout the procedure, the                     patient's blood pressure, pulse, and oxygen saturations                     were monitored continuously. The Colonoscope was                     introduced through the anus and advanced to the the cecum,                     identified by appendiceal orifice and ileocecal valve. The                     colonoscopy was performed without difficulty. The patient  tolerated the procedure well. The quality of the bowel       preparation was fair. Findings:      Many small-mouthed diverticula were found in the sigmoid colon and in       the descending colon. Biopsies from left side of colon were taken with a       cold forceps for histology.      The exam was otherwise without abnormality. Impression:        - Diverticulosis in the sigmoid colon and in the                     descending colon. Biopsied.                    - The examination was otherwise normal. Recommendation:    - Discharge patient to home.                    - Await pathology results.                    - The findings and recommendations were discussed with the                     patient. Procedure Code(s): --- Professional ---                    [phone], Colonoscopy, flexible; with biopsy, single or                     multiple Diagnosis Code(s): --- Professional ---                    Z12.11, Encounter for screening for malignant neoplasm of                     colon                    K57.30, Diverticulosis of large intestine without                     perforation or abscess without bleeding CPT copyright 2014 American Medical Association. All rights reserved. The codes documented in this report are preliminary and upon coder review may  be revised to meet current compliance requirements. Hulen Luster, MD 12/31/2014 9:55:07 AM This report has been signed electronically. Number of Addenda: 0 Note Initiated On: 12/31/2014 9:32 AM Scope Withdrawal Time: 0 hours 8 minutes 36 seconds  Total Procedure Duration: 0 hours 15 minutes 49 seconds       [facility]

## 2014-12-31 NOTE — H&P (Signed)
  Date of Initial H&P: 12/07/2014  History reviewed, patient examined, no change in status, stable for surgery.

## 2014-12-31 NOTE — Transfer of Care (Signed)
Immediate Anesthesia Transfer of Care Note  Patient: Leslie Love  Procedure(s) Performed: Procedure(s): COLONOSCOPY (N/A)  Patient Location: PACU  Anesthesia Type:General  Level of Consciousness: awake and alert   Airway & Oxygen Therapy: Patient Spontanous Breathing and Patient connected to nasal cannula oxygen  Post-op Assessment: Report given to RN and Post -op Vital signs reviewed and stable  Post vital signs: Reviewed and stable  Last Vitals:  Filed Vitals:   12/31/14 0906  BP: 119/60  Pulse: 82  Temp: 36.8 C  Resp: 20    Complications: No apparent anesthesia complications

## 2014-12-31 NOTE — Anesthesia Preprocedure Evaluation (Signed)
Anesthesia Evaluation  Patient identified by MRN, date of birth, ID band Patient awake    Reviewed: Allergy & Precautions, NPO status , Patient's Chart, lab work & pertinent test results, reviewed documented beta blocker date and time   Airway Mallampati: II       Dental  (+) Teeth Intact   Pulmonary neg pulmonary ROS, former smoker,    breath sounds clear to auscultation       Cardiovascular hypertension, Pt. on home beta blockers  Rhythm:Regular Rate:Normal     Neuro/Psych    GI/Hepatic Neg liver ROS, GERD  ,  Endo/Other  negative endocrine ROS  Renal/GU negative Renal ROS     Musculoskeletal negative musculoskeletal ROS (+)   Abdominal (+) + obese,   Peds  Hematology negative hematology ROS (+)   Anesthesia Other Findings   Reproductive/Obstetrics                             Anesthesia Physical Anesthesia Plan  ASA: II  Anesthesia Plan: General   Post-op Pain Management:    Induction: Intravenous  Airway Management Planned: Nasal Cannula  Additional Equipment:   Intra-op Plan:   Post-operative Plan:   Informed Consent: I have reviewed the patients History and Physical, chart, labs and discussed the procedure including the risks, benefits and alternatives for the proposed anesthesia with the patient or authorized representative who has indicated his/her understanding and acceptance.     Plan Discussed with: CRNA  Anesthesia Plan Comments:         Anesthesia Quick Evaluation

## 2015-01-01 LAB — SURGICAL PATHOLOGY

## 2015-01-02 ENCOUNTER — Encounter: Payer: Self-pay | Admitting: Gastroenterology

## 2015-01-15 ENCOUNTER — Ambulatory Visit
Admission: RE | Admit: 2015-01-15 | Discharge: 2015-01-15 | Disposition: A | Discharge status: Home / Self Care | Payer: Medicare Other | Source: Ambulatory Visit | Attending: Oncology | Admitting: Oncology

## 2015-01-15 DIAGNOSIS — K760 Fatty (change of) liver, not elsewhere classified: Secondary | ICD-10-CM | POA: Insufficient documentation

## 2015-01-15 DIAGNOSIS — E041 Nontoxic single thyroid nodule: Secondary | ICD-10-CM | POA: Insufficient documentation

## 2015-01-15 DIAGNOSIS — R911 Solitary pulmonary nodule: Secondary | ICD-10-CM | POA: Insufficient documentation

## 2015-01-15 MED ORDER — IOHEXOL 300 MG/ML  SOLN: 50.0000 mL | Freq: Once | INTRAMUSCULAR | Status: DC | PRN

## 2015-01-16 ENCOUNTER — Inpatient Hospital Stay: Payer: Medicare Other | Attending: Oncology | Admitting: Oncology

## 2015-01-16 VITALS — BP 122/74 | HR 69 | Temp 97.3°F | Resp 16 | Wt 170.4 lb

## 2015-01-16 DIAGNOSIS — Z79899 Other long term (current) drug therapy: Secondary | ICD-10-CM

## 2015-01-16 DIAGNOSIS — M8580 Other specified disorders of bone density and structure, unspecified site: Secondary | ICD-10-CM | POA: Diagnosis not present

## 2015-01-16 DIAGNOSIS — Z79810 Long term (current) use of selective estrogen receptor modulators (SERMs): Secondary | ICD-10-CM | POA: Diagnosis not present

## 2015-01-16 DIAGNOSIS — Z170 Estrogen receptor positive status [ER+]: Secondary | ICD-10-CM | POA: Diagnosis not present

## 2015-01-16 DIAGNOSIS — K219 Gastro-esophageal reflux disease without esophagitis: Secondary | ICD-10-CM

## 2015-01-16 DIAGNOSIS — I10 Essential (primary) hypertension: Secondary | ICD-10-CM | POA: Insufficient documentation

## 2015-01-16 DIAGNOSIS — Z7982 Long term (current) use of aspirin: Secondary | ICD-10-CM | POA: Insufficient documentation

## 2015-01-16 DIAGNOSIS — R232 Flushing: Secondary | ICD-10-CM | POA: Insufficient documentation

## 2015-01-16 DIAGNOSIS — Z923 Personal history of irradiation: Secondary | ICD-10-CM | POA: Diagnosis not present

## 2015-01-16 DIAGNOSIS — E785 Hyperlipidemia, unspecified: Secondary | ICD-10-CM | POA: Diagnosis not present

## 2015-01-16 DIAGNOSIS — R911 Solitary pulmonary nodule: Secondary | ICD-10-CM | POA: Insufficient documentation

## 2015-01-16 DIAGNOSIS — D0512 Intraductal carcinoma in situ of left breast: Secondary | ICD-10-CM | POA: Diagnosis not present

## 2015-01-16 DIAGNOSIS — Z87891 Personal history of nicotine dependence: Secondary | ICD-10-CM | POA: Insufficient documentation

## 2015-01-16 NOTE — Progress Notes (Signed)
Patient is currently being treated for cololittis.

## 2015-01-30 ENCOUNTER — Other Ambulatory Visit: Payer: Self-pay | Admitting: Oncology

## 2015-01-30 ENCOUNTER — Ambulatory Visit
Admission: RE | Admit: 2015-01-30 | Discharge: 2015-01-30 | Disposition: A | Discharge status: Home / Self Care | Payer: Medicare Other | Source: Ambulatory Visit | Attending: Oncology | Admitting: Oncology

## 2015-01-30 DIAGNOSIS — Z853 Personal history of malignant neoplasm of breast: Secondary | ICD-10-CM | POA: Insufficient documentation

## 2015-01-30 DIAGNOSIS — Z9882 Breast implant status: Secondary | ICD-10-CM | POA: Diagnosis not present

## 2015-01-30 DIAGNOSIS — D0512 Intraductal carcinoma in situ of left breast: Secondary | ICD-10-CM

## 2015-01-30 HISTORY — DX: Malignant neoplasm of unspecified site of unspecified female breast: C50.919

## 2015-02-02 NOTE — Progress Notes (Signed)
[place]  Telephone:([phone] Fax:(336) [phone]  ID: RYATT STYLE OB: 1945/12/10  MR#: [id number]  [id number]  Patient Care Team: Idelle Crouch, MD as PCP - General (Internal Medicine)  CHIEF COMPLAINT:  Chief Complaint  Patient presents with  . DCIS    INTERVAL HISTORY: Patient returns to clinic today for further evaluation and discussion of her imaging results. She continues to tolerate tamoxifen well, but still complains of occasional hot flashes.  She denies any pain.  She has no neurologic complaints.  She denies any recent fevers.  She has a good appetite and denies weight loss.  She has no chest pain or shortness of breath.  She denies any nausea, vomiting, constipation, or diarrhea.  She has no urinary complaints.  Patient offers no further specific complaints today.  REVIEW OF SYSTEMS:   Review of Systems  Constitutional: Negative.  Negative for fever and malaise/fatigue.  Respiratory: Negative.   Cardiovascular: Negative.   Gastrointestinal: Negative.   Musculoskeletal: Negative.   Neurological: Positive for sensory change. Negative for weakness.    As per HPI. Otherwise, a complete review of systems is negatve.  PAST MEDICAL HISTORY: Past Medical History  Diagnosis Date  . Hypertension   . Cancer [facility])     Left breast cancer 2014, lumpectomy  . Chronic insomnia   . GERD (gastroesophageal reflux disease)   . History of gastritis   . History of shingles   . Hyperlipidemia   . Nocturnal leg cramps   . Osteopenia   . Palpitations   . Vitamin D deficiency   . 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  FAMILY HISTORY: Blood clots, diabetes, heart disease, sister died > 92 years ago of leukemia, grandmother with breast cancer, mother with cervical cancer.     ADVANCED DIRECTIVES:    HEALTH MAINTENANCE: Social History  Substance Use Topics  .  Smoking status: Former Research scientist (life sciences)  . Smokeless tobacco: Not on file  . Alcohol Use: No     Colonoscopy:  PAP:  Bone density:  Lipid panel:  Allergies  Allergen Reactions  . Codeine Nausea And Vomiting    Current Outpatient Prescriptions  Medication Sig Dispense Refill  . ALPRAZolam (XANAX) 0.25 MG tablet Take 0.25 mg by mouth every 8 (eight) hours.  5  . aspirin EC 81 MG tablet Take by mouth.    . bisoprolol-hydrochlorothiazide (ZIAC) 5-6.25 MG per tablet     . Calcium Carbonate-Vitamin D (CALCIUM + D PO) Take by mouth.    . ciprofloxacin (CIPRO) 500 MG tablet Take by mouth.    . Coenzyme Q10 (CO Q-10) 100 MG CAPS Take by mouth.    . metroNIDAZOLE (FLAGYL) 500 MG tablet Take by mouth.    Marland Kitchen omeprazole (PRILOSEC) 20 MG capsule     . pravastatin (PRAVACHOL) 40 MG tablet     . Red Yeast Rice Extract 600 MG CAPS Take by mouth.    . tamoxifen (NOLVADEX) 20 MG tablet      No current facility-administered medications for this visit.    OBJECTIVE: Filed Vitals:   01/16/15 1211  BP: 122/74  Pulse: 69  Temp: 97.3 F (36.3 C)  Resp: 16     Body mass index is 29.24 kg/(m^2).    ECOG FS:0 - Asymptomatic  General: Well-developed, well-nourished, no acute distress. Eyes: anicteric sclera. Breasts: Bilateral breasts and axilla without lumps or masses. Lungs: Clear to auscultation bilaterally.  Heart: Regular rate and rhythm. No rubs, murmurs, or gallops. Abdomen: Soft, nontender, nondistended. No organomegaly noted, normoactive bowel sounds. Musculoskeletal: No edema, cyanosis, or clubbing. Neuro: Alert, answering all questions appropriately. Cranial nerves grossly intact. Skin: No rashes or petechiae noted. Psych: Normal affect.  LAB RESULTS:  Lab Results  Component Value Date   NA 140 05/14/2012   K 3.0* 05/14/2012   CL 106 05/14/2012   CO2 25 05/14/2012   GLUCOSE 138* 05/14/2012   BUN 16 05/14/2012   CREATININE 1.00 05/14/2012   CALCIUM 7.9* 05/14/2012   PROT 6.6  05/14/2012   ALBUMIN 3.7 05/14/2012   AST 26 05/14/2012   ALT 22 05/14/2012   ALKPHOS 72 05/14/2012   BILITOT 0.3 05/14/2012   GFRNONAA 57* 05/14/2012   GFRAA >60 05/14/2012    Lab Results  Component Value Date   WBC 9.3 05/14/2012   NEUTROABS 3.3 02/19/2012   HGB 15.5 05/14/2012   HCT 45.5 05/14/2012   MCV 84 05/14/2012   PLT 248 05/14/2012     STUDIES: Ct Chest W Contrast  01/15/2015  CLINICAL DATA:  Left breast cancer with lumpectomy and radiation therapy 3 years ago. Pulmonary nodule followup particularly for right lower lobe ground-glass nodules. EXAM: CT CHEST WITH CONTRAST TECHNIQUE: Multidetector CT imaging of the chest was performed during intravenous contrast administration. CONTRAST:  75 cc Omnipaque 300 COMPARISON:  Multiple exams, including 08/08/2014 FINDINGS: Mediastinum/Nodes: Solid right thyroid nodule extends towards the isthmus and measures 1.3 by 1.0 cm on image 8 series 2. Branch vessel and aortic arch atherosclerotic calcification noted with punctate atherosclerotic calcification in the left anterior descending coronary artery. Right lower paratracheal lymph node 0.7 cm in short axis, image 22 series 2. No pathologic thoracic adenopathy identified. Bilateral subglandular breast implants with capsular calcification. Lungs/Pleura: Mild biapical pleural-parenchymal scarring. Faint nodularity in the right upper lobe, stable and in the 2-3 mm range. Very indistinctly marginated hazy sub solid nodule in the right lower lobe subpleural location, 9 by 8 mm on image 41 series 3, no solid component, previously the same back on 04/17/2014. Sub solid nodule without solid component in the left lower lobe on image 39 series 3 measures 1.2 by 0.9 cm and by my measurements was previously the same size on 04/17/2014. Very faint 0.5 by 0.4 cm right lower lobe pulmonary nodule, sub solid, image 46 series 3, stable. Peripherally in the left upper lobe there is a very faint sub solid nodule  measuring 0.8 by 0.7 cm, stable from 04/17/2014. Upper abdomen: Diffuse steatosis of the visualized liver. Small enhancing hepatic lesion measuring 5 mm in segment 4 on image 56 series 2, not appreciably changed from 08/08/2014. Musculoskeletal: Unremarkable IMPRESSION: 1. Several sub solid nodules in the right lower lobe and left upper lobe, stable over the past 9 months. No dominant lesion. These could be postinflammatory but low grade adenocarcinoma is not readily excluded. Current guidelines call for annual surveillance. Accordingly I recommend a followup CT chest in 1 years time. This recommendation follows the consensus statement: Recommendations for the Management of Subsolid Pulmonary Nodules Detected at CT: A Statement from the [place]. Radiology [id number]. 2. Solid right thyroid nodule, 1.3 by 1.0 cm. Consider further evaluation with thyroid ultrasound. If patient is clinically hyperthyroid, consider nuclear medicine thyroid uptake and scan. 3. Hepatic steatosis with a stable 5 mm faint enhancing nodule in segment 4. This has not changed over the intervening 5 months. Most likely this is a small hemangioma or similar benign lesion.  Consider making the follow up chest CT with contrast in order to be able to reassess this lesion. Electronically Signed   By: Van Clines M.D.   On: 01/15/2015 17:08   Mm Diag Breast W/implant Tomo Bilateral  01/30/2015  CLINICAL DATA:  Status post left lumpectomy and radiation therapy for breast cancer in 2013. EXAM: DIGITAL DIAGNOSTIC BILATERAL MAMMOGRAM WITH IMPLANTS, 3D TOMOSYNTHESIS WITH CAD The patient has retroglandular implants. Standard and implant displaced views were performed. COMPARISON:  Previous exam(s). ACR Breast Density Category c: The breast tissue is heterogeneously dense, which may obscure small masses. FINDINGS: Stable mammographic appearance of the breasts with no interval findings suspicious for malignancy in either breast.  Mammographic images were processed with CAD. IMPRESSION: No evidence of malignancy. RECOMMENDATION: Bilateral diagnostic mammogram in 1 year. I have discussed the findings and recommendations with the patient. Results were also provided in writing at the conclusion of the visit. If applicable, a reminder letter will be sent to the patient regarding the next appointment. BI-RADS CATEGORY  1: Negative. Electronically Signed   By: Claudie Revering M.D.   On: 01/30/2015 15:13    ASSESSMENT: DCIS, pulmonary nodule.  PLAN:    1.  DCIS: No evidence of disease.  Continue tamoxifen completing in February 2019.  Patient's most recent mammogram on January 30, 2015 was reported as BI-RADS 1 repeat in December 2017. Return to clinic in 6 months for routine evaluation. 2. Pulmonary nodule: CT scan from January 15, 2015  reviewed independently and reported as above. Will repeat imaging in one year.  3. Family history: Patient is negative for BCRA 1 and 2.  Patient expressed understanding and was in agreement with this plan. She also understands that She can call clinic at any time with any questions, concerns, or complaints.   Lloyd Huger, MD   02/02/2015 11:11 AM

## 2015-03-15 ENCOUNTER — Ambulatory Visit: Payer: Self-pay | Admitting: Radiation Oncology

## 2015-03-22 ENCOUNTER — Ambulatory Visit
Admission: RE | Admit: 2015-03-22 | Discharge: 2015-03-22 | Disposition: A | Discharge status: Home / Self Care | Payer: Medicare Other | Source: Ambulatory Visit | Attending: Radiation Oncology | Admitting: Radiation Oncology

## 2015-03-22 ENCOUNTER — Encounter: Payer: Self-pay | Admitting: Radiation Oncology

## 2015-03-22 VITALS — BP 129/78 | HR 82 | Temp 96.8°F | Resp 18 | Wt 169.4 lb

## 2015-03-22 DIAGNOSIS — D0512 Intraductal carcinoma in situ of left breast: Secondary | ICD-10-CM

## 2015-03-22 NOTE — Progress Notes (Signed)
Radiation Oncology Follow up Note  Name: Leslie Love   Date:   03/22/2015 MRN:  [id number] DOB: August 30, 1945    This 70 y.o. 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  REFERRING PROVIDER: Idelle Crouch, MD  HPI: Patient is a 70 year old female now out over 3 years having completed radiation therapy to her left breast for ductal carcinoma in situ ER/PR positive. She is seen today in routine follow-up and is doing well. Follow-up mammograms. Last one performed tach in December were BI-RADS 1 negative for malignancy. She has breast implants stating back I believe 30 years. She specifically denies breast tenderness cough or bone pain. Currently on tamoxifen tolerating that well without side effect.  COMPLICATIONS OF TREATMENT: none  FOLLOW UP COMPLIANCE: keeps appointments   PHYSICAL EXAM:  BP 129/78 mmHg  Pulse 82  Temp(Src) 96.8 F (36 C)  Resp 18  Wt 169 lb 6.8 oz (76.85 kg) Patient has bilateral breast exams making the breast extremely firm. No mass or nodularity in either breast is detected. No axillary or supraclavicular adenopathy is identified. Breasts were examined in 2 positions. Well-developed well-nourished patient in NAD. HEENT reveals PERLA, EOMI, discs not visualized.  Oral cavity is clear. No oral mucosal lesions are identified. Neck is clear without evidence of cervical or supraclavicular adenopathy. Lungs are clear to A&P. Cardiac examination is essentially unremarkable with regular rate and rhythm without murmur rub or thrill. Abdomen is benign with no organomegaly or masses noted. Motor sensory and DTR levels are equal and symmetric in the upper and lower extremities. Cranial nerves II through XII are grossly intact. Proprioception is intact. No peripheral adenopathy or edema is identified. No motor or sensory levels are noted. Crude visual fields are within  normal range.  RADIOLOGY RESULTS: Previous mammograms reviewed  PLAN: Present time she continues to do well with no evidence of disease over 3 years out. I am please were overall progress. I have asked to see her back in 1 year for follow-up. Patient knows to call sooner with any concerns.    Armstead Peaks., MD

## 2015-07-17 ENCOUNTER — Inpatient Hospital Stay: Payer: Medicare Other | Attending: Oncology | Admitting: Oncology

## 2015-07-17 VITALS — BP 117/75 | HR 72 | Temp 96.2°F | Resp 18 | Wt 178.6 lb

## 2015-07-17 DIAGNOSIS — R232 Flushing: Secondary | ICD-10-CM

## 2015-07-17 DIAGNOSIS — Z79810 Long term (current) use of selective estrogen receptor modulators (SERMs): Secondary | ICD-10-CM | POA: Insufficient documentation

## 2015-07-17 DIAGNOSIS — C50919 Malignant neoplasm of unspecified site of unspecified female breast: Secondary | ICD-10-CM

## 2015-07-17 DIAGNOSIS — Z7982 Long term (current) use of aspirin: Secondary | ICD-10-CM | POA: Diagnosis not present

## 2015-07-17 DIAGNOSIS — R911 Solitary pulmonary nodule: Secondary | ICD-10-CM | POA: Diagnosis not present

## 2015-07-17 DIAGNOSIS — K219 Gastro-esophageal reflux disease without esophagitis: Secondary | ICD-10-CM | POA: Diagnosis not present

## 2015-07-17 DIAGNOSIS — Z87891 Personal history of nicotine dependence: Secondary | ICD-10-CM | POA: Diagnosis not present

## 2015-07-17 DIAGNOSIS — Z170 Estrogen receptor positive status [ER+]: Secondary | ICD-10-CM | POA: Insufficient documentation

## 2015-07-17 DIAGNOSIS — M8580 Other specified disorders of bone density and structure, unspecified site: Secondary | ICD-10-CM | POA: Diagnosis not present

## 2015-07-17 DIAGNOSIS — D0512 Intraductal carcinoma in situ of left breast: Secondary | ICD-10-CM | POA: Diagnosis not present

## 2015-07-17 DIAGNOSIS — Z79899 Other long term (current) drug therapy: Secondary | ICD-10-CM | POA: Diagnosis not present

## 2015-07-17 DIAGNOSIS — I10 Essential (primary) hypertension: Secondary | ICD-10-CM | POA: Diagnosis not present

## 2015-07-17 DIAGNOSIS — G4700 Insomnia, unspecified: Secondary | ICD-10-CM

## 2015-07-17 NOTE — Progress Notes (Signed)
States is having bilateral hip pain that states is due to arthritis. The past few days has had a large bruise on right upper thigh that appeared after bug bite and thinks she may have scratched that area to cause the bruising.

## 2015-07-23 NOTE — Progress Notes (Signed)
[place]  Telephone:(336) [phone] Fax:(336) [phone]  ID: Leslie Love OB: 11-19-1945  MR#: [id number]  [id number]  Patient Care Team: Idelle Crouch, MD as PCP - General (Internal Medicine)  CHIEF COMPLAINT:  Chief Complaint  Patient presents with  . Breast Cancer    INTERVAL HISTORY: Patient returns to clinic today for routine 6 month evaluation. She continues to tolerate tamoxifen well, but still complains of occasional hot flashes.  She denies any pain.  She has no neurologic complaints.  She denies any recent fevers.  She has a good appetite and denies weight loss.  She has no chest pain, cough, hemoptysis, or shortness of breath.  She denies any nausea, vomiting, constipation, or diarrhea.  She has no urinary complaints.  Patient offers no further specific complaints today.  REVIEW OF SYSTEMS:   Review of Systems  Constitutional: Negative.  Negative for fever and malaise/fatigue.  Respiratory: Negative.  Negative for cough, hemoptysis and shortness of breath.   Cardiovascular: Negative.  Negative for chest pain.  Gastrointestinal: Negative.  Negative for abdominal pain.  Musculoskeletal: Negative.   Neurological: Positive for sensory change. Negative for weakness.  Psychiatric/Behavioral: Negative.     As per HPI. Otherwise, a complete review of systems is negatve.  PAST MEDICAL HISTORY: Past Medical History  Diagnosis Date  . Hypertension   . Cancer [facility])     Left breast cancer 2014, lumpectomy  . Chronic insomnia   . GERD (gastroesophageal reflux disease)   . History of gastritis   . History of shingles   . Hyperlipidemia   . Nocturnal leg cramps   . Osteopenia   . Palpitations   . Vitamin D deficiency   . 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  FAMILY HISTORY: Blood clots, diabetes, heart disease, sister died > 106 years ago of leukemia, grandmother with  breast cancer, mother with cervical cancer.     ADVANCED DIRECTIVES:    HEALTH MAINTENANCE: Social History  Substance Use Topics  . Smoking status: Former Research scientist (life sciences)  . Smokeless tobacco: Not on file  . Alcohol Use: No     Colonoscopy:  PAP:  Bone density:  Lipid panel:  Allergies  Allergen Reactions  . Codeine Nausea And Vomiting    Current Outpatient Prescriptions  Medication Sig Dispense Refill  . ALPRAZolam (XANAX) 0.25 MG tablet Take 0.25 mg by mouth every 8 (eight) hours.  5  . aspirin EC 81 MG tablet Take 81 mg by mouth daily.     . bisoprolol-hydrochlorothiazide (ZIAC) 5-6.25 MG per tablet Take 1 tablet by mouth daily.     . Calcium Carbonate-Vitamin D (CALCIUM + D PO) Take by mouth.    . Cyanocobalamin (RA VITAMIN B-12 TR) 1000 MCG TBCR Take 1 tablet by mouth daily.     Marland Kitchen omeprazole (PRILOSEC) 20 MG capsule Take 20 mg by mouth daily.     . pravastatin (PRAVACHOL) 40 MG tablet Take 40 mg by mouth daily.     . tamoxifen (NOLVADEX) 20 MG tablet Take 20 mg by mouth daily.      No current facility-administered medications for this visit.    OBJECTIVE: Filed Vitals:   07/17/15 1045  BP: 117/75  Pulse: 72  Temp: 96.2 F (35.7 C)  Resp: 18     Body mass index is 30.64 kg/(m^2).    ECOG FS:0 - Asymptomatic  General: Well-developed, well-nourished, no acute distress. Eyes: anicteric  sclera. Breasts: Bilateral breasts and axilla without lumps or masses. Lungs: Clear to auscultation bilaterally. Heart: Regular rate and rhythm. No rubs, murmurs, or gallops. Abdomen: Soft, nontender, nondistended. No organomegaly noted, normoactive bowel sounds. Musculoskeletal: No edema, cyanosis, or clubbing. Neuro: Alert, answering all questions appropriately. Cranial nerves grossly intact. Skin: No rashes or petechiae noted. Psych: Normal affect.  LAB RESULTS:  Lab Results  Component Value Date   NA 140 05/14/2012   K 3.0* 05/14/2012   CL 106 05/14/2012   CO2 25 05/14/2012    GLUCOSE 138* 05/14/2012   BUN 16 05/14/2012   CREATININE 1.00 05/14/2012   CALCIUM 7.9* 05/14/2012   PROT 6.6 05/14/2012   ALBUMIN 3.7 05/14/2012   AST 26 05/14/2012   ALT 22 05/14/2012   ALKPHOS 72 05/14/2012   BILITOT 0.3 05/14/2012   GFRNONAA 57* 05/14/2012   GFRAA >60 05/14/2012    Lab Results  Component Value Date   WBC 9.3 05/14/2012   NEUTROABS 3.3 02/19/2012   HGB 15.5 05/14/2012   HCT 45.5 05/14/2012   MCV 84 05/14/2012   PLT 248 05/14/2012     STUDIES: No results found.  ASSESSMENT: DCIS, pulmonary nodule.  PLAN:    1.  DCIS: No evidence of disease.  Continue tamoxifen completing 5 years of treatment in February 2019.  Patient's most recent mammogram on January 30, 2015 was reported as BI-RADS 1 repeat in December 2017. Return to clinic in 6 months for routine evaluation. 2. Pulmonary nodule: CT scan from January 15, 2015  reviewed independently and reported as above. Will repeat In 6 months prior to next clinic visit. 3. Family history: Patient is negative for BCRA 1 and 2.  Patient expressed understanding and was in agreement with this plan. She also understands that She can call clinic at any time with any questions, concerns, or complaints.   Lloyd Huger, MD   07/23/2015 2:24 PM

## 2015-10-18 DIAGNOSIS — E538 Deficiency of other specified B group vitamins: Secondary | ICD-10-CM | POA: Insufficient documentation

## 2015-10-23 ENCOUNTER — Other Ambulatory Visit: Payer: Self-pay | Admitting: Internal Medicine

## 2015-10-23 DIAGNOSIS — R911 Solitary pulmonary nodule: Secondary | ICD-10-CM

## 2015-10-30 ENCOUNTER — Ambulatory Visit
Admission: RE | Admit: 2015-10-30 | Discharge: 2015-10-30 | Disposition: A | Discharge status: Home / Self Care | Payer: Medicare Other | Source: Ambulatory Visit | Attending: Gastroenterology | Admitting: Gastroenterology

## 2015-10-30 ENCOUNTER — Other Ambulatory Visit: Payer: Self-pay | Admitting: Gastroenterology

## 2015-10-30 DIAGNOSIS — K760 Fatty (change of) liver, not elsewhere classified: Secondary | ICD-10-CM | POA: Insufficient documentation

## 2015-10-30 DIAGNOSIS — K5730 Diverticulosis of large intestine without perforation or abscess without bleeding: Secondary | ICD-10-CM | POA: Insufficient documentation

## 2015-10-30 DIAGNOSIS — R1032 Left lower quadrant pain: Secondary | ICD-10-CM

## 2015-10-30 DIAGNOSIS — R918 Other nonspecific abnormal finding of lung field: Secondary | ICD-10-CM | POA: Diagnosis not present

## 2015-10-30 MED ORDER — IOPAMIDOL (ISOVUE-300) INJECTION 61%
100.0000 mL | Freq: Once | INTRAVENOUS | Status: AC | PRN
  Administered 2015-10-30: 100 mL via INTRAVENOUS

## 2015-10-31 ENCOUNTER — Other Ambulatory Visit: Payer: Self-pay | Admitting: Gastroenterology

## 2015-10-31 ENCOUNTER — Other Ambulatory Visit
Admission: RE | Admit: 2015-10-31 | Discharge: 2015-10-31 | Disposition: A | Discharge status: Home / Self Care | Payer: Medicare Other | Source: Ambulatory Visit | Attending: Gastroenterology | Admitting: Gastroenterology

## 2015-10-31 DIAGNOSIS — K769 Liver disease, unspecified: Secondary | ICD-10-CM

## 2015-10-31 DIAGNOSIS — R197 Diarrhea, unspecified: Secondary | ICD-10-CM | POA: Insufficient documentation

## 2015-10-31 DIAGNOSIS — R935 Abnormal findings on diagnostic imaging of other abdominal regions, including retroperitoneum: Secondary | ICD-10-CM

## 2015-10-31 LAB — C DIFFICILE QUICK SCREEN W PCR REFLEX
C DIFFICILE (CDIFF) INTERP: DETECTED
C DIFFICILE (CDIFF) TOXIN: POSITIVE — AB
C DIFFICLE (CDIFF) ANTIGEN: POSITIVE — AB

## 2015-11-08 ENCOUNTER — Ambulatory Visit
Admission: RE | Admit: 2015-11-08 | Discharge: 2015-11-08 | Disposition: A | Discharge status: Home / Self Care | Payer: Medicare Other | Source: Ambulatory Visit | Attending: Gastroenterology | Admitting: Gastroenterology

## 2015-11-08 DIAGNOSIS — K760 Fatty (change of) liver, not elsewhere classified: Secondary | ICD-10-CM | POA: Insufficient documentation

## 2015-11-08 DIAGNOSIS — R935 Abnormal findings on diagnostic imaging of other abdominal regions, including retroperitoneum: Secondary | ICD-10-CM | POA: Insufficient documentation

## 2015-11-08 DIAGNOSIS — K769 Liver disease, unspecified: Secondary | ICD-10-CM | POA: Diagnosis present

## 2015-11-08 MED ORDER — GADOBENATE DIMEGLUMINE 529 MG/ML IV SOLN
15.0000 mL | Freq: Once | INTRAVENOUS | Status: AC | PRN
  Administered 2015-11-08: 15 mL via INTRAVENOUS

## 2015-11-13 ENCOUNTER — Ambulatory Visit: Payer: Medicare Other

## 2016-01-17 ENCOUNTER — Ambulatory Visit
Admission: RE | Admit: 2016-01-17 | Discharge: 2016-01-17 | Disposition: A | Discharge status: Home / Self Care | Payer: Medicare Other | Source: Ambulatory Visit | Attending: Oncology | Admitting: Oncology

## 2016-01-17 DIAGNOSIS — R918 Other nonspecific abnormal finding of lung field: Secondary | ICD-10-CM | POA: Diagnosis not present

## 2016-01-17 DIAGNOSIS — R911 Solitary pulmonary nodule: Secondary | ICD-10-CM | POA: Insufficient documentation

## 2016-01-17 DIAGNOSIS — E041 Nontoxic single thyroid nodule: Secondary | ICD-10-CM | POA: Insufficient documentation

## 2016-01-17 DIAGNOSIS — C50919 Malignant neoplasm of unspecified site of unspecified female breast: Secondary | ICD-10-CM | POA: Insufficient documentation

## 2016-01-17 LAB — POCT I-STAT CREATININE: Creatinine, Ser: 0.7 mg/dL (ref 0.44–1.00)

## 2016-01-17 MED ORDER — IOPAMIDOL (ISOVUE-300) INJECTION 61%
75.0000 mL | Freq: Once | INTRAVENOUS | Status: AC | PRN
  Administered 2016-01-17: 75 mL via INTRAVENOUS

## 2016-01-19 DIAGNOSIS — D0512 Intraductal carcinoma in situ of left breast: Secondary | ICD-10-CM | POA: Insufficient documentation

## 2016-01-19 DIAGNOSIS — R911 Solitary pulmonary nodule: Secondary | ICD-10-CM | POA: Insufficient documentation

## 2016-01-19 NOTE — Progress Notes (Signed)
[place]  [phone] [phone]  ID: Leslie Love OB: [birth year]  MR#: [id number]  [id number]  Patient Care Team: Idelle Crouch, MD as PCP - General (Internal Medicine)  CHIEF COMPLAINT: Left breast DCIS, Left pulmonary nodule.  INTERVAL HISTORY: Patient returns to clinic today for routine 6 month evaluation and discussion of her imaging results. She continues to tolerate tamoxifen well, but still complains of occasional hot flashes.  She denies any pain. She has no neurologic complaints.  She denies any recent fevers.  She has a good appetite and denies weight loss.  She has no chest pain, cough, hemoptysis, or shortness of breath.  She denies any nausea, vomiting, constipation, or diarrhea.  She has no urinary complaints.  Patient offers no further specific complaints today.  REVIEW OF SYSTEMS:   Review of Systems  Constitutional: Negative.  Negative for fever, malaise/fatigue and weight loss.  Respiratory: Negative.  Negative for cough, hemoptysis and shortness of breath.   Cardiovascular: Negative.  Negative for chest pain and leg swelling.  Gastrointestinal: Negative.  Negative for abdominal pain.  Genitourinary: Negative.   Musculoskeletal: Negative.   Neurological: Positive for sensory change. Negative for weakness.  Psychiatric/Behavioral: Negative.  The patient is not nervous/anxious.     As per HPI. Otherwise, a complete review of systems is negative.  PAST MEDICAL HISTORY: Past Medical History:  Diagnosis Date  . Breast cancer ([place]) 2013   DCIS  . Cancer [facility])    Left breast cancer 2014, lumpectomy  . Chronic insomnia   . GERD (gastroesophageal reflux disease)   . History of gastritis   . History of shingles   . Hyperlipidemia   . Hypertension   . Nocturnal leg cramps   . Osteopenia   . Palpitations   . Vitamin D deficiency     PAST SURGICAL HISTORY: Left lumpectomy, partial hysterectomy,  appendectomy.  FAMILY HISTORY: Blood clots, diabetes, heart disease, sister died > 67 years ago of leukemia, grandmother with breast cancer, mother with cervical cancer.     ADVANCED DIRECTIVES:    HEALTH MAINTENANCE: Social History  Substance Use Topics  . Smoking status: Former Research scientist (life sciences)  . Smokeless tobacco: Not on file  . Alcohol use No     Colonoscopy:  PAP:  Bone density:  Lipid panel:  Allergies  Allergen Reactions  . Codeine Nausea And Vomiting    Current Outpatient Prescriptions  Medication Sig Dispense Refill  . ALPRAZolam (XANAX) 0.25 MG tablet Take 0.25 mg by mouth every 8 (eight) hours.  5  . aspirin EC 81 MG tablet Take 81 mg by mouth daily.     . bisoprolol-hydrochlorothiazide (ZIAC) 5-6.25 MG per tablet Take 1 tablet by mouth daily.     . Calcium Carbonate-Vitamin D (CALCIUM + D PO) Take by mouth.    . Cyanocobalamin (RA VITAMIN B-12 TR) 1000 MCG TBCR Take 1 tablet by mouth daily.     Marland Kitchen omeprazole (PRILOSEC) 20 MG capsule Take 20 mg by mouth daily.     . pravastatin (PRAVACHOL) 40 MG tablet Take 40 mg by mouth daily.     . tamoxifen (NOLVADEX) 20 MG tablet Take 20 mg by mouth daily.      No current facility-administered medications for this visit.     OBJECTIVE: Vitals:   01/20/16 1059  BP: 117/70  Pulse: 74  Resp: 18  Temp: 98.2 F (36.8 C)     Body mass index is 29.55 kg/m.    ECOG FS:0 -  Asymptomatic  General: Well-developed, well-nourished, no acute distress. Eyes: anicteric sclera. Breasts: Bilateral breasts and axilla without lumps or masses. Lungs: Clear to auscultation bilaterally. Heart: Regular rate and rhythm. No rubs, murmurs, or gallops. Abdomen: Soft, nontender, nondistended. No organomegaly noted, normoactive bowel sounds. Musculoskeletal: No edema, cyanosis, or clubbing. Neuro: Alert, answering all questions appropriately. Cranial nerves grossly intact. Skin: No rashes or petechiae noted. Psych: Normal affect.  LAB  RESULTS:  Lab Results  Component Value Date   NA 140 05/14/2012   K 3.0 (L) 05/14/2012   CL 106 05/14/2012   CO2 25 05/14/2012   GLUCOSE 138 (H) 05/14/2012   BUN 16 05/14/2012   CREATININE 0.70 01/17/2016   CALCIUM 7.9 (L) 05/14/2012   PROT 6.6 05/14/2012   ALBUMIN 3.7 05/14/2012   AST 26 05/14/2012   ALT 22 05/14/2012   ALKPHOS 72 05/14/2012   BILITOT 0.3 05/14/2012   GFRNONAA 57 (L) 05/14/2012   GFRAA >60 05/14/2012    Lab Results  Component Value Date   WBC 9.3 05/14/2012   NEUTROABS 3.3 02/19/2012   HGB 15.5 05/14/2012   HCT 45.5 05/14/2012   MCV 84 05/14/2012   PLT 248 05/14/2012     STUDIES: Ct Chest W Contrast  Result Date: 01/17/2016 CLINICAL DATA:  Left breast cancer post lumpectomy and radiation therapy in 2013. Staging breast cancer and nodules. EXAM: CT CHEST WITH CONTRAST TECHNIQUE: Multidetector CT imaging of the chest was performed during intravenous contrast administration. CONTRAST:  75mL ISOVUE-300 IOPAMIDOL (ISOVUE-300) INJECTION 61% COMPARISON:  CT 01/15/2015, 08/08/2014 and 04/17/2014. Abdominal MRI 11/08/2015. FINDINGS: Cardiovascular: Atherosclerosis of the aorta, great vessels and coronary arteries. No acute vascular findings are seen. The heart size is normal. There is no pericardial effusion. Mediastinum/Nodes: There are no enlarged mediastinal, hilar, internal mammary or axillary lymph nodes. Small mediastinal lymph nodes are stable. There is increasing nodularity within the right thyroid isthmus, measuring up to 1.6 cm on image 19.The trachea and esophagus demonstrate no significant findings. Lungs/Pleura: There is no pleural effusion. Pulmonary evaluation mildly limited by a lesser inspiration on the current examination. There is a focal ground-glass density medially in the right lower lobe measuring 12 x 9 mm on image 86 which appears unchanged. The previously described smaller ground-glass densities are grossly stable, although suboptimally  evaluated based on the motion. There is a stable 5 mm left lower lobe solid nodule on image 119. Additional tiny pulmonary nodules are stable. No new or enlarging pulmonary nodules. Upper abdomen: Probable hepatic steatosis. Stable subcentimeter relatively dense lesion in the left lobe on image 131. No adrenal mass. Musculoskeletal/Chest wall: There is no chest wall mass or suspicious osseous finding. Densely calcified bilateral breast implants noted. IMPRESSION: 1. No evidence of metastatic breast cancer. 2. The lungs appear unchanged from baseline study of 04/17/2014. There are scattered small pulmonary nodules and ground-glass densities, largest medially in the right lower lobe. Follow up noncontrast chest CT in 2 years is recommended. This recommendation follows the consensus statement: Guidelines for Management of Incidental Pulmonary Nodules Detected on CT Images: From the Fleischner Society 2017; Radiology 2017; 284:228-243. 3. Nodule in the right thyroid isthmus appears slightly larger. If not previously performed, consider thyroid ultrasound for further evaluation. Electronically Signed   By: Richardean Sale M.D.   On: 01/17/2016 14:14    ASSESSMENT: Left breast DCIS, left pulmonary nodule.  PLAN:    1. Left breast DCIS: No evidence of disease.  Continue tamoxifen completing 5 years of treatment in February 2019.  Patient's most recent mammogram on January 30, 2015 was reported as BI-RADS 1. Repeat mammogram scheduled for January 31, 2016. Return to clinic in 6 months for routine evaluation. 2. Left pulmonary nodule: CT scan from January 17, 2016 reviewed independently and reported as above. No concern for metastatic disease. No further imaging is necessary. 3. Family history: Patient is negative for BCRA 1 and 2.  Patient expressed understanding and was in agreement with this plan. She also understands that She can call clinic at any time with any questions, concerns, or complaints.    Lloyd Huger, MD   01/21/2016 11:11 AM

## 2016-01-20 ENCOUNTER — Inpatient Hospital Stay: Payer: Medicare Other | Attending: Oncology | Admitting: Oncology

## 2016-01-20 DIAGNOSIS — I700 Atherosclerosis of aorta: Secondary | ICD-10-CM | POA: Insufficient documentation

## 2016-01-20 DIAGNOSIS — I2510 Atherosclerotic heart disease of native coronary artery without angina pectoris: Secondary | ICD-10-CM | POA: Diagnosis not present

## 2016-01-20 DIAGNOSIS — Z79899 Other long term (current) drug therapy: Secondary | ICD-10-CM | POA: Diagnosis not present

## 2016-01-20 DIAGNOSIS — Z87891 Personal history of nicotine dependence: Secondary | ICD-10-CM | POA: Diagnosis not present

## 2016-01-20 DIAGNOSIS — Z170 Estrogen receptor positive status [ER+]: Secondary | ICD-10-CM | POA: Diagnosis not present

## 2016-01-20 DIAGNOSIS — D0512 Intraductal carcinoma in situ of left breast: Secondary | ICD-10-CM | POA: Insufficient documentation

## 2016-01-20 DIAGNOSIS — Z806 Family history of leukemia: Secondary | ICD-10-CM | POA: Diagnosis not present

## 2016-01-20 DIAGNOSIS — I10 Essential (primary) hypertension: Secondary | ICD-10-CM | POA: Diagnosis not present

## 2016-01-20 DIAGNOSIS — R918 Other nonspecific abnormal finding of lung field: Secondary | ICD-10-CM | POA: Insufficient documentation

## 2016-01-20 DIAGNOSIS — E785 Hyperlipidemia, unspecified: Secondary | ICD-10-CM | POA: Insufficient documentation

## 2016-01-20 DIAGNOSIS — M8580 Other specified disorders of bone density and structure, unspecified site: Secondary | ICD-10-CM | POA: Diagnosis not present

## 2016-01-20 DIAGNOSIS — K219 Gastro-esophageal reflux disease without esophagitis: Secondary | ICD-10-CM | POA: Insufficient documentation

## 2016-01-20 DIAGNOSIS — E559 Vitamin D deficiency, unspecified: Secondary | ICD-10-CM | POA: Insufficient documentation

## 2016-01-20 DIAGNOSIS — R911 Solitary pulmonary nodule: Secondary | ICD-10-CM

## 2016-01-20 DIAGNOSIS — Z8619 Personal history of other infectious and parasitic diseases: Secondary | ICD-10-CM | POA: Insufficient documentation

## 2016-01-20 DIAGNOSIS — Z8719 Personal history of other diseases of the digestive system: Secondary | ICD-10-CM | POA: Diagnosis not present

## 2016-01-20 DIAGNOSIS — Z79810 Long term (current) use of selective estrogen receptor modulators (SERMs): Secondary | ICD-10-CM | POA: Insufficient documentation

## 2016-01-20 DIAGNOSIS — Z7982 Long term (current) use of aspirin: Secondary | ICD-10-CM | POA: Diagnosis not present

## 2016-01-20 DIAGNOSIS — Z803 Family history of malignant neoplasm of breast: Secondary | ICD-10-CM | POA: Diagnosis not present

## 2016-01-20 NOTE — Progress Notes (Signed)
Patient is here for follow up, she is here for CT resuls

## 2016-01-31 ENCOUNTER — Other Ambulatory Visit: Payer: Self-pay | Admitting: Oncology

## 2016-01-31 ENCOUNTER — Ambulatory Visit
Admission: RE | Admit: 2016-01-31 | Discharge: 2016-01-31 | Disposition: A | Discharge status: Home / Self Care | Payer: Medicare Other | Source: Ambulatory Visit | Attending: Oncology | Admitting: Oncology

## 2016-01-31 ENCOUNTER — Encounter: Payer: Self-pay | Admitting: *Deleted

## 2016-01-31 DIAGNOSIS — C50919 Malignant neoplasm of unspecified site of unspecified female breast: Secondary | ICD-10-CM

## 2016-01-31 DIAGNOSIS — T85898A Other specified complication of other internal prosthetic devices, implants and grafts, initial encounter: Secondary | ICD-10-CM | POA: Diagnosis not present

## 2016-01-31 DIAGNOSIS — Y838 Other surgical procedures as the cause of abnormal reaction of the patient, or of later complication, without mention of misadventure at the time of the procedure: Secondary | ICD-10-CM | POA: Diagnosis not present

## 2016-02-04 ENCOUNTER — Ambulatory Visit: Payer: Medicare Other | Admitting: Anesthesiology

## 2016-02-04 ENCOUNTER — Ambulatory Visit
Admission: RE | Admit: 2016-02-04 | Discharge: 2016-02-04 | Disposition: A | Discharge status: Home / Self Care | Payer: Medicare Other | Source: Ambulatory Visit | Attending: Gastroenterology | Admitting: Gastroenterology

## 2016-02-04 ENCOUNTER — Encounter
Admission: RE | Disposition: A | Discharge status: Home / Self Care | Payer: Self-pay | Source: Ambulatory Visit | Attending: Gastroenterology

## 2016-02-04 DIAGNOSIS — Z79899 Other long term (current) drug therapy: Secondary | ICD-10-CM | POA: Diagnosis not present

## 2016-02-04 DIAGNOSIS — Z7982 Long term (current) use of aspirin: Secondary | ICD-10-CM | POA: Diagnosis not present

## 2016-02-04 DIAGNOSIS — K219 Gastro-esophageal reflux disease without esophagitis: Secondary | ICD-10-CM | POA: Insufficient documentation

## 2016-02-04 DIAGNOSIS — E538 Deficiency of other specified B group vitamins: Secondary | ICD-10-CM | POA: Insufficient documentation

## 2016-02-04 DIAGNOSIS — K5730 Diverticulosis of large intestine without perforation or abscess without bleeding: Secondary | ICD-10-CM | POA: Diagnosis not present

## 2016-02-04 DIAGNOSIS — Z79810 Long term (current) use of selective estrogen receptor modulators (SERMs): Secondary | ICD-10-CM | POA: Diagnosis not present

## 2016-02-04 DIAGNOSIS — Z8719 Personal history of other diseases of the digestive system: Secondary | ICD-10-CM | POA: Insufficient documentation

## 2016-02-04 DIAGNOSIS — Z87891 Personal history of nicotine dependence: Secondary | ICD-10-CM | POA: Diagnosis not present

## 2016-02-04 DIAGNOSIS — I10 Essential (primary) hypertension: Secondary | ICD-10-CM | POA: Diagnosis not present

## 2016-02-04 DIAGNOSIS — Z09 Encounter for follow-up examination after completed treatment for conditions other than malignant neoplasm: Secondary | ICD-10-CM | POA: Insufficient documentation

## 2016-02-04 DIAGNOSIS — K640 First degree hemorrhoids: Secondary | ICD-10-CM | POA: Diagnosis not present

## 2016-02-04 DIAGNOSIS — E785 Hyperlipidemia, unspecified: Secondary | ICD-10-CM | POA: Diagnosis not present

## 2016-02-04 HISTORY — PX: COLONOSCOPY WITH PROPOFOL: SHX5780

## 2016-02-04 HISTORY — DX: Deficiency of other specified B group vitamins: E53.8

## 2016-02-04 HISTORY — DX: Anemia, unspecified: D64.9

## 2016-02-04 SURGERY — COLONOSCOPY WITH PROPOFOL
Anesthesia: General

## 2016-02-04 MED ORDER — LIDOCAINE 2% (20 MG/ML) 5 ML SYRINGE
INTRAMUSCULAR | Status: AC
  Filled 2016-02-04: qty 5

## 2016-02-04 MED ORDER — FENTANYL CITRATE (PF) 100 MCG/2ML IJ SOLN
INTRAMUSCULAR | Status: AC
  Filled 2016-02-04: qty 2

## 2016-02-04 MED ORDER — SODIUM CHLORIDE 0.9 % IV SOLN
INTRAVENOUS | Status: DC
  Administered 2016-02-04 (×2): via INTRAVENOUS

## 2016-02-04 MED ORDER — SODIUM CHLORIDE 0.9 % IV SOLN: INTRAVENOUS | Status: DC

## 2016-02-04 MED ORDER — PROPOFOL 10 MG/ML IV BOLUS
INTRAVENOUS | Status: DC | PRN
  Administered 2016-02-04: 30 mg via INTRAVENOUS
  Administered 2016-02-04: 50 mg via INTRAVENOUS

## 2016-02-04 MED ORDER — MIDAZOLAM HCL 2 MG/2ML IJ SOLN
INTRAMUSCULAR | Status: AC
  Filled 2016-02-04: qty 2

## 2016-02-04 MED ORDER — PHENYLEPHRINE HCL 10 MG/ML IJ SOLN
INTRAMUSCULAR | Status: AC
  Filled 2016-02-04: qty 1

## 2016-02-04 MED ORDER — PROPOFOL 500 MG/50ML IV EMUL
INTRAVENOUS | Status: AC
  Filled 2016-02-04: qty 50

## 2016-02-04 MED ORDER — MIDAZOLAM HCL 2 MG/2ML IJ SOLN
INTRAMUSCULAR | Status: DC | PRN
Start: 2016-02-04 — End: 2016-02-04
  Administered 2016-02-04: 1 mg via INTRAVENOUS

## 2016-02-04 MED ORDER — PROPOFOL 10 MG/ML IV BOLUS
INTRAVENOUS | Status: AC
  Filled 2016-02-04: qty 20

## 2016-02-04 MED ORDER — FENTANYL CITRATE (PF) 100 MCG/2ML IJ SOLN
INTRAMUSCULAR | Status: DC | PRN
Start: 2016-02-04 — End: 2016-02-04
  Administered 2016-02-04: 50 ug via INTRAVENOUS

## 2016-02-04 MED ORDER — PROPOFOL 500 MG/50ML IV EMUL
INTRAVENOUS | Status: DC | PRN
  Administered 2016-02-04: 150 ug/kg/min via INTRAVENOUS

## 2016-02-04 NOTE — Anesthesia Procedure Notes (Signed)
Performed by: Allean Found Pre-anesthesia Checklist: Patient identified, Emergency Drugs available, Suction available, Patient being monitored and Timeout performed Patient Re-evaluated:Patient Re-evaluated prior to inductionOxygen Delivery Method: Nasal cannula Placement Confirmation: positive ETCO2

## 2016-02-04 NOTE — H&P (Signed)
Outpatient short stay form Pre-procedure 02/04/2016 11:19 AM Leslie Sails MD  Primary Physician: Dr. Fulton Reek  Reason for visit:  Colonoscopy  History of present illness:  Patient is a 71 year old female presenting today as above. She has a history of diverticulitis that has been recurrent over the past year. 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 sHe is currently asymptomatic. She has held her 81 mg aspirin. She takes no other aspirin products or blood thinning agents. She tolerated her prep well.    Current Facility-Administered Medications:  .  0.9 %  sodium chloride infusion, , Intravenous, Continuous, Leslie Sails, MD, Last Rate: 20 mL/hr at 02/04/16 0936 .  0.9 %  sodium chloride infusion, , Intravenous, Continuous, Leslie Sails, MD  Prescriptions Prior to Admission  Medication Sig Dispense Refill Last Dose  . ALPRAZolam (XANAX) 0.25 MG tablet Take 0.25 mg by mouth every 8 (eight) hours.  5 02/04/2016 at Unknown time  . aspirin EC 81 MG tablet Take 81 mg by mouth daily.    02/02/2016 at Unknown time  . bisoprolol-hydrochlorothiazide (ZIAC) 5-6.25 MG per tablet Take 1 tablet by mouth daily.    02/03/2016 at Unknown time  . Calcium Carbonate-Vitamin D (CALCIUM + D PO) Take by mouth.   Past Week at Unknown time  . co-enzyme Q-10 50 MG capsule Take 100 mg by mouth daily.   Past Week at Unknown time  . Cyanocobalamin (RA VITAMIN B-12 TR) 1000 MCG TBCR Take 1 tablet by mouth daily.    Past Week at Unknown time  . omeprazole (PRILOSEC) 20 MG capsule Take 20 mg by mouth daily.    02/04/2016 at Unknown time  . pravastatin (PRAVACHOL) 40 MG tablet Take 40 mg by mouth daily.    02/04/2016 at Unknown time  . tamoxifen (NOLVADEX) 20 MG tablet Take 20 mg by mouth daily.    02/03/2016 at Unknown time  . clotrimazole-betamethasone (LOTRISONE) cream Apply 1 application topically 2 (two) times daily.   Not Taking at Unknown  time     Allergies  Allergen Reactions  . Codeine Nausea And Vomiting     Past Medical History:  Diagnosis Date  . Anemia   . Breast cancer ([place]) 2013   DCIS  . Cancer [facility])    Left breast cancer 2014, lumpectomy  . Chronic insomnia   . GERD (gastroesophageal reflux disease)   . History of gastritis   . History of shingles   . Hyperlipidemia   . Hypertension   . Nocturnal leg cramps   . Osteopenia   . Osteopenia   . Palpitations   . Vitamin B 12 deficiency   . Vitamin D deficiency     Review of systems:      Physical Exam    Heart and lungs: Irregular rate and rhythm without rub or gallop, lungs are bilaterally clear.    HEENT: Normocephalic atraumatic eyes are anicteric    Other:     Pertinant exam for procedure: Soft nontender nondistended bowel sounds positive normoactive.    Planned proceedures: Colonoscopy and indicated procedures. I have discussed the risks benefits and complications of procedures to include not limited to bleeding, infection, perforation and the risk of sedation and the patient wishes to proceed.    Leslie Sails, MD Gastroenterology 02/04/2016  11:19 AM

## 2016-02-04 NOTE — Anesthesia Preprocedure Evaluation (Signed)
Anesthesia Evaluation  Patient identified by MRN, date of birth, ID band Patient awake    Reviewed: Allergy & Precautions, H&P , NPO status , Patient's Chart, lab work & pertinent test results  History of Anesthesia Complications Negative for: history of anesthetic complications  Airway Mallampati: III  TM Distance: <3 FB Neck ROM: limited    Dental  (+) Poor Dentition, Chipped   Pulmonary neg shortness of breath, former smoker,    Pulmonary exam normal breath sounds clear to auscultation       Cardiovascular Exercise Tolerance: Good hypertension, (-) angina(-) Past MI and (-) DOE Normal cardiovascular exam Rhythm:regular Rate:Normal     Neuro/Psych negative neurological ROS  negative psych ROS   GI/Hepatic Neg liver ROS, GERD  Medicated and Controlled,  Endo/Other  negative endocrine ROS  Renal/GU negative Renal ROS  negative genitourinary   Musculoskeletal   Abdominal   Peds  Hematology negative hematology ROS (+)   Anesthesia Other Findings Past Medical History: No date: Anemia 2013: Breast cancer ([place])     Comment: DCIS No date: Cancer ([place])     Comment: Left breast cancer 2014, lumpectomy No date: Chronic insomnia No date: GERD (gastroesophageal reflux disease) No date: History of gastritis No date: History of shingles No date: Hyperlipidemia No date: Hypertension No date: Nocturnal leg cramps No date: Osteopenia No date: Osteopenia No date: Palpitations No date: Vitamin B 12 deficiency No date: Vitamin D deficiency  Past Surgical History: No date: ABDOMINAL HYSTERECTOMY No date: APPENDECTOMY 1980: AUGMENTATION MAMMAPLASTY Bilateral 2013: BREAST BIOPSY Left     Comment: DCIS 2013: BREAST LUMPECTOMY Left No date: BREAST LUMPECTOMY W/ NEEDLE LOCALIZATION Left No date: BREAST SURGERY No date: COLONOSCOPY 12/31/2014: COLONOSCOPY N/A     Comment: Procedure: COLONOSCOPY;  Surgeon: Hulen Luster,              MD;  Location: [facility] ENDOSCOPY;  Service:               Gastroenterology;  Laterality: N/A; No date: ESOPHAGOGASTRODUODENOSCOPY No date: KNEE ARTHROSCOPY Left     Comment: partial medial and lateral menisceoctomy  BMI    Body Mass Index:  29.01 kg/m      Reproductive/Obstetrics negative OB ROS                             Anesthesia Physical Anesthesia Plan  ASA: III  Anesthesia Plan: General   Post-op Pain Management:    Induction:   Airway Management Planned:   Additional Equipment:   Intra-op Plan:   Post-operative Plan:   Informed Consent: I have reviewed the patients History and Physical, chart, labs and discussed the procedure including the risks, benefits and alternatives for the proposed anesthesia with the patient or authorized representative who has indicated his/her understanding and acceptance.   Dental Advisory Given  Plan Discussed with: Anesthesiologist, CRNA and Surgeon  Anesthesia Plan Comments:         Anesthesia Quick Evaluation

## 2016-02-04 NOTE — Anesthesia Postprocedure Evaluation (Signed)
Anesthesia Post Note  Patient: Leslie Love  Procedure(s) Performed: Procedure(s) (LRB): COLONOSCOPY WITH PROPOFOL (N/A)  Patient location during evaluation: Endoscopy Anesthesia Type: General Level of consciousness: awake and alert Pain management: pain level controlled Vital Signs Assessment: post-procedure vital signs reviewed and stable Respiratory status: spontaneous breathing, nonlabored ventilation, respiratory function stable and patient connected to nasal cannula oxygen Cardiovascular status: blood pressure returned to baseline and stable Postop Assessment: no signs of nausea or vomiting Anesthetic complications: no     Last Vitals:  Vitals:   02/04/16 1234 02/04/16 1244  BP: (!) 120/59 126/70  Pulse: 73 68  Resp: 15 11  Temp:      Last Pain:  Vitals:   02/04/16 1214  TempSrc: Tympanic                 Precious Haws Piscitello

## 2016-02-04 NOTE — Op Note (Signed)
[facility] Patient Name: Leslie Love Procedure Date: 02/04/2016 11:36 AM MRN: [id number] Account #: [id number] Date of Birth: 09/15/1945 Admit Type: Outpatient Age: 71 Room: [facility] ENDO ROOM 3 Gender: Female Note Status: Finalized Procedure:            Colonoscopy Indications:          Follow-up of diverticulitis Providers:            Lollie Sails, MD Referring MD:         Leonie Douglas. Doy Hutching, MD (Referring MD) Medicines:            Monitored Anesthesia Care Complications:        No immediate complications. Procedure:            Pre-Anesthesia Assessment:                       - ASA Grade Assessment: II - A patient with mild                        systemic disease.                       After obtaining informed consent, the colonoscope was                        passed under direct vision. Throughout the procedure,                        the patient's blood pressure, pulse, and oxygen                        saturations were monitored continuously. The                        Colonoscope was introduced through the anus and                        advanced to the the cecum, identified by appendiceal                        orifice and ileocecal valve. The patient tolerated the                        procedure well. The quality of the bowel preparation                        was good. Findings:      Multiple small to medium-mouthed diverticula were found in the sigmoid       colon and distal descending colon. There was no active inflamation.      Biopsies for histology were taken with a cold forceps from the right       colon and left colon for evaluation of microscopic colitis.      Non-bleeding internal hemorrhoids were found during retroflexion. The       hemorrhoids were small and Grade I (internal hemorrhoids that do not       prolapse).      No additional abnormalities were found on retroflexion. Impression:           - Diverticulosis in  the sigmoid colon and in the distal  descending colon.                       - Non-bleeding internal hemorrhoids.                       - Biopsies were taken with a cold forceps from the                        right colon and left colon for evaluation of                        microscopic colitis. Recommendation:       - Discharge patient to home.                       - Await pathology results.                       - Telephone GI clinic for pathology results in 1 week. Procedure Code(s):    --- Professional ---                       [phone], Colonoscopy, flexible; with biopsy, single or                        multiple Diagnosis Code(s):    --- Professional ---                       K64.0, First degree hemorrhoids                       K57.32, Diverticulitis of large intestine without                        perforation or abscess without bleeding                       K57.30, Diverticulosis of large intestine without                        perforation or abscess without bleeding CPT copyright 2016 American Medical Association. All rights reserved. The codes documented in this report are preliminary and upon coder review may  be revised to meet current compliance requirements. Lollie Sails, MD 02/04/2016 12:09:25 PM This report has been signed electronically. Number of Addenda: 0 Note Initiated On: 02/04/2016 11:36 AM Scope Withdrawal Time: 0 hours 7 minutes 43 seconds  Total Procedure Duration: 0 hours 20 minutes 47 seconds       [facility]

## 2016-02-04 NOTE — Transfer of Care (Signed)
Immediate Anesthesia Transfer of Care Note  Patient: Leslie Love  Procedure(s) Performed: Procedure(s): COLONOSCOPY WITH PROPOFOL (N/A)  Patient Location: PACU  Anesthesia Type:General  Level of Consciousness: sedated  Airway & Oxygen Therapy: Patient Spontanous Breathing and Patient connected to face mask oxygen  Post-op Assessment: Report given to RN and Post -op Vital signs reviewed and stable  Post vital signs: Reviewed and stable  Last Vitals:  Vitals:   02/04/16 0912  BP: 107/87  Pulse: 72  Resp: 18  Temp: 36.9 C    Last Pain:  Vitals:   02/04/16 0912  TempSrc: Tympanic         Complications: No apparent anesthesia complications

## 2016-02-05 ENCOUNTER — Encounter: Payer: Self-pay | Admitting: Gastroenterology

## 2016-02-05 LAB — SURGICAL PATHOLOGY

## 2016-02-06 ENCOUNTER — Other Ambulatory Visit: Payer: Self-pay | Admitting: *Deleted

## 2016-02-06 DIAGNOSIS — T8543XA Leakage of breast prosthesis and implant, initial encounter: Secondary | ICD-10-CM

## 2016-02-20 DIAGNOSIS — Z923 Personal history of irradiation: Secondary | ICD-10-CM | POA: Insufficient documentation

## 2016-02-20 DIAGNOSIS — Z9882 Breast implant status: Secondary | ICD-10-CM | POA: Insufficient documentation

## 2016-02-21 ENCOUNTER — Encounter: Payer: Self-pay | Admitting: *Deleted

## 2016-03-04 NOTE — H&P (Signed)
Subjective:     Patient ID: Leslie Love is a 71 y.o. female.  HPI   Referred by Dr. Grayland Ormond for evaluation bilateral breast implant rupture noted on recent screening MMG/US 01/2016. 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 Notes 15 lb wt gain since starting tamoxifen. Notes has recurrent irritation beneath left breast since time of radiation.  History augmentation mammaplasty retropectoral age 71 via periareolar approach. No information on implant size, states after initial augmentation more of a "lift"; cannot recall if size changed significantly. Current D cup. States she had augmentation due to her prior husband and has felt too large. Would like to replace implant but smaller.  Genetics negative for BRCA1/2.  CLINICAL DATA: 71 year old female presenting for annual evaluation status post left breast lumpectomy in 2013. The patient has history of bilateral breast augmentation in approximately 1980.  EXAM: 2D DIGITAL DIAGNOSTIC BILATERAL MAMMOGRAM WITH IMPLANTS, CAD AND ADJUNCT TOMO  The patient has retropectoral implants. Standard and implant displaced views were performed.  BILATERAL BREAST ULTRASOUND  COMPARISON: Previous exam(s).  ACR Breast Density Category b: There are scattered areas of fibroglandular density.  FINDINGS: The left breast lumpectomy site is stable. No suspicious calcifications, masses or areas of distortion are seen in the bilateral breasts. There are contour abnormalities of the implants bilaterally suggestive of extracapsular silicone.  Mammographic images were processed with CAD.  Ultrasound of the bilateral breasts demonstrates multiple areas of snowstorm appearance compatible with extracapsular rupture.  IMPRESSION: 1. No mammographic evidence of malignancy in the bilateral breasts.  2. Mammographic and sonographic evidence of extracapsular  implant rupture.  RECOMMENDATION: 1. Diagnostic mammogram is suggested in 1 year. (Code:DM-B-01Y).  2. Surgical consultation for bilateral implant rupture.  I have discussed the findings and recommendations with the patient. Results were also provided in writing at the conclusion of the visit. If applicable, a reminder letter will be sent to the patient regarding the next appointment.  BI-RADS CATEGORY 2: Benign.   Electronically Signed By: Ammie Ferrier M.D. On: 01/31/2016 12:45  Review of Systems  HENT: Positive for hearing loss and sinus pressure.   Endocrine: Positive for polydipsia and polyuria.  Musculoskeletal: Positive for arthralgias.  Remainder 12 point review negative     Objective:   Physical Exam  Constitutional: She is oriented to person, place, and time.  Cardiovascular: Normal rate.   Pulmonary/Chest: Effort normal.  Lymphadenopathy:    She has no axillary adenopathy.  Neurological: She is alert and oriented to person, place, and time.   Grade 2 ptosis bilateral, Baker 3 on right Baker 4 left Telangiectasias at left IMF, left NAC scar from 1 to 6 to 9 o clock 5-6 cm soft tissue pinch over both implants, no animation SN to nipple R 29.5  L 28 cm BW R 18 L 18 cm Nipple to IMF R 13 L 12 cm     Assessment:     History left breast ca s/p lumpectomy S/p augmentation mammaplasty with bilateral extracapsular silicone rupture    Plan:     Reviewed intra vs extracapsular rupture- although not emergency do recommend surgery to remove implants and implant material as the extracapsular rupture may progress and free silicone in breast tissue can produce nodularity/granulomas in breast tissue that will be difficult to follow from cancer standpoint. Given bilateral contracture recommend bilateral capsulectomies. Recommend IMF approach to address this. Counseled I have no information on her current implant size so will have to order a  range, I  may not have any idea during surgery of current implant size due to rupture and given age of implants, may not have any implant information on stamp on implant surface. Regardless, she has bilateral ptosis and recommend mastopexy. Reviewed anchor type scars, possible drains. We discussed that history of radiation on her left puts her at higher risk recurrent capsular contracture and wound healing problems. Discussed OP surgery, pictures taken. Discussed her current contracture likely due  Both to rupture and history XRT.      Irene Limbo, MD [facility] [phone], pin [phone]

## 2016-03-09 ENCOUNTER — Encounter (HOSPITAL_BASED_OUTPATIENT_CLINIC_OR_DEPARTMENT_OTHER): Payer: Self-pay | Admitting: *Deleted

## 2016-03-10 ENCOUNTER — Encounter (HOSPITAL_BASED_OUTPATIENT_CLINIC_OR_DEPARTMENT_OTHER)
Admission: RE | Admit: 2016-03-10 | Discharge: 2016-03-10 | Disposition: A | Discharge status: Home / Self Care | Payer: Medicare Other | Source: Ambulatory Visit | Attending: Plastic Surgery | Admitting: Plastic Surgery

## 2016-03-10 DIAGNOSIS — Z01818 Encounter for other preprocedural examination: Secondary | ICD-10-CM | POA: Insufficient documentation

## 2016-03-10 DIAGNOSIS — T8549XA Other mechanical complication of breast prosthesis and implant, initial encounter: Secondary | ICD-10-CM | POA: Insufficient documentation

## 2016-03-10 DIAGNOSIS — R9431 Abnormal electrocardiogram [ECG] [EKG]: Secondary | ICD-10-CM | POA: Diagnosis not present

## 2016-03-10 DIAGNOSIS — X58XXXA Exposure to other specified factors, initial encounter: Secondary | ICD-10-CM | POA: Diagnosis not present

## 2016-03-10 LAB — BASIC METABOLIC PANEL
Anion gap: 10 (ref 5–15)
BUN: 13 mg/dL (ref 6–20)
CALCIUM: 9.1 mg/dL (ref 8.9–10.3)
CO2: 27 mmol/L (ref 22–32)
CREATININE: 0.82 mg/dL (ref 0.44–1.00)
Chloride: 102 mmol/L (ref 101–111)
Glucose, Bld: 106 mg/dL — ABNORMAL HIGH (ref 65–99)
Potassium: 4.1 mmol/L (ref 3.5–5.1)
SODIUM: 139 mmol/L (ref 135–145)

## 2016-03-10 NOTE — Progress Notes (Signed)
Dr. Gifford Shave reviewed EKG, will proceed with surgery as scheduled.

## 2016-04-03 ENCOUNTER — Ambulatory Visit: Payer: Medicare Other | Admitting: Radiation Oncology

## 2016-04-10 ENCOUNTER — Encounter (HOSPITAL_BASED_OUTPATIENT_CLINIC_OR_DEPARTMENT_OTHER)
Admission: RE | Disposition: A | Discharge status: Home / Self Care | Payer: Self-pay | Source: Ambulatory Visit | Attending: Plastic Surgery

## 2016-04-10 ENCOUNTER — Encounter (HOSPITAL_BASED_OUTPATIENT_CLINIC_OR_DEPARTMENT_OTHER): Payer: Self-pay | Admitting: Anesthesiology

## 2016-04-10 ENCOUNTER — Ambulatory Visit (HOSPITAL_BASED_OUTPATIENT_CLINIC_OR_DEPARTMENT_OTHER): Payer: Medicare Other | Admitting: Anesthesiology

## 2016-04-10 ENCOUNTER — Ambulatory Visit (HOSPITAL_BASED_OUTPATIENT_CLINIC_OR_DEPARTMENT_OTHER)
Admission: RE | Admit: 2016-04-10 | Discharge: 2016-04-11 | Disposition: A | Discharge status: Home / Self Care | Payer: Medicare Other | Source: Ambulatory Visit | Attending: Plastic Surgery | Admitting: Plastic Surgery

## 2016-04-10 DIAGNOSIS — E538 Deficiency of other specified B group vitamins: Secondary | ICD-10-CM | POA: Insufficient documentation

## 2016-04-10 DIAGNOSIS — Z79899 Other long term (current) drug therapy: Secondary | ICD-10-CM | POA: Diagnosis not present

## 2016-04-10 DIAGNOSIS — I878 Other specified disorders of veins: Secondary | ICD-10-CM | POA: Diagnosis not present

## 2016-04-10 DIAGNOSIS — I10 Essential (primary) hypertension: Secondary | ICD-10-CM | POA: Diagnosis not present

## 2016-04-10 DIAGNOSIS — Z923 Personal history of irradiation: Secondary | ICD-10-CM | POA: Diagnosis not present

## 2016-04-10 DIAGNOSIS — Z7982 Long term (current) use of aspirin: Secondary | ICD-10-CM | POA: Insufficient documentation

## 2016-04-10 DIAGNOSIS — E559 Vitamin D deficiency, unspecified: Secondary | ICD-10-CM | POA: Diagnosis not present

## 2016-04-10 DIAGNOSIS — T8544XA Capsular contracture of breast implant, initial encounter: Secondary | ICD-10-CM | POA: Diagnosis not present

## 2016-04-10 DIAGNOSIS — Z853 Personal history of malignant neoplasm of breast: Secondary | ICD-10-CM

## 2016-04-10 DIAGNOSIS — Z87891 Personal history of nicotine dependence: Secondary | ICD-10-CM | POA: Diagnosis not present

## 2016-04-10 DIAGNOSIS — Y831 Surgical operation with implant of artificial internal device as the cause of abnormal reaction of the patient, or of later complication, without mention of misadventure at the time of the procedure: Secondary | ICD-10-CM | POA: Insufficient documentation

## 2016-04-10 DIAGNOSIS — Z86000 Personal history of in-situ neoplasm of breast: Secondary | ICD-10-CM | POA: Diagnosis not present

## 2016-04-10 DIAGNOSIS — K219 Gastro-esophageal reflux disease without esophagitis: Secondary | ICD-10-CM | POA: Insufficient documentation

## 2016-04-10 DIAGNOSIS — T8543XA Leakage of breast prosthesis and implant, initial encounter: Secondary | ICD-10-CM | POA: Insufficient documentation

## 2016-04-10 DIAGNOSIS — Z79810 Long term (current) use of selective estrogen receptor modulators (SERMs): Secondary | ICD-10-CM | POA: Diagnosis not present

## 2016-04-10 DIAGNOSIS — E785 Hyperlipidemia, unspecified: Secondary | ICD-10-CM | POA: Diagnosis not present

## 2016-04-10 HISTORY — DX: Other complications of anesthesia, initial encounter: T88.59XA

## 2016-04-10 HISTORY — DX: Nausea with vomiting, unspecified: R11.2

## 2016-04-10 HISTORY — DX: Adverse effect of unspecified anesthetic, initial encounter: T41.45XA

## 2016-04-10 HISTORY — PX: CAPSULECTOMY: SHX5381

## 2016-04-10 HISTORY — PX: BREAST IMPLANT REMOVAL: SHX5361

## 2016-04-10 HISTORY — PX: MASTOPEXY: SHX5358

## 2016-04-10 HISTORY — DX: Other specified postprocedural states: Z98.890

## 2016-04-10 SURGERY — REMOVAL, IMPLANT, BREAST
Anesthesia: General | Laterality: Bilateral

## 2016-04-10 MED ORDER — CEFAZOLIN SODIUM-DEXTROSE 2-4 GM/100ML-% IV SOLN
INTRAVENOUS | Status: AC
  Filled 2016-04-10: qty 100

## 2016-04-10 MED ORDER — OXYCODONE HCL 5 MG/5ML PO SOLN: 5.0000 mg | Freq: Once | ORAL | Status: DC | PRN

## 2016-04-10 MED ORDER — HYDROMORPHONE HCL 1 MG/ML IJ SOLN
0.5000 mg | INTRAMUSCULAR | Status: DC | PRN
  Administered 2016-04-11: 1 mg via INTRAVENOUS
  Filled 2016-04-10: qty 1

## 2016-04-10 MED ORDER — SUGAMMADEX SODIUM 200 MG/2ML IV SOLN
INTRAVENOUS | Status: AC
  Filled 2016-04-10: qty 2

## 2016-04-10 MED ORDER — SUCCINYLCHOLINE CHLORIDE 20 MG/ML IJ SOLN
INTRAMUSCULAR | Status: DC | PRN
  Administered 2016-04-10: 100 mg via INTRAVENOUS

## 2016-04-10 MED ORDER — FENTANYL CITRATE (PF) 100 MCG/2ML IJ SOLN
INTRAMUSCULAR | Status: AC
  Filled 2016-04-10: qty 2

## 2016-04-10 MED ORDER — MEPERIDINE HCL 25 MG/ML IJ SOLN: 6.2500 mg | INTRAMUSCULAR | Status: DC | PRN

## 2016-04-10 MED ORDER — LACTATED RINGERS IV SOLN
INTRAVENOUS | Status: DC | PRN
  Administered 2016-04-10 (×2): via INTRAVENOUS

## 2016-04-10 MED ORDER — PROMETHAZINE HCL 25 MG/ML IJ SOLN
6.2500 mg | INTRAMUSCULAR | Status: DC | PRN
  Administered 2016-04-10: 6.25 mg via INTRAVENOUS

## 2016-04-10 MED ORDER — LIDOCAINE 2% (20 MG/ML) 5 ML SYRINGE
INTRAMUSCULAR | Status: DC | PRN
Start: 2016-04-10 — End: 2016-04-10
  Administered 2016-04-10: 50 mg via INTRAVENOUS

## 2016-04-10 MED ORDER — CHLORHEXIDINE GLUCONATE CLOTH 2 % EX PADS: 6.0000 | MEDICATED_PAD | Freq: Once | CUTANEOUS | Status: DC

## 2016-04-10 MED ORDER — GABAPENTIN 300 MG PO CAPS
300.0000 mg | ORAL_CAPSULE | ORAL | Status: AC
  Administered 2016-04-10: 300 mg via ORAL

## 2016-04-10 MED ORDER — SUCCINYLCHOLINE CHLORIDE 200 MG/10ML IV SOSY
PREFILLED_SYRINGE | INTRAVENOUS | Status: AC
  Filled 2016-04-10: qty 10

## 2016-04-10 MED ORDER — SCOPOLAMINE 1 MG/3DAYS TD PT72
1.0000 | MEDICATED_PATCH | Freq: Once | TRANSDERMAL | Status: DC | PRN
Start: 2016-04-10 — End: 2016-04-10
  Administered 2016-04-10: 1.5 mg via TRANSDERMAL

## 2016-04-10 MED ORDER — BUPIVACAINE LIPOSOME 1.3 % IJ SUSP
INTRAMUSCULAR | Status: AC
  Filled 2016-04-10: qty 20

## 2016-04-10 MED ORDER — PROMETHAZINE HCL 25 MG/ML IJ SOLN
INTRAMUSCULAR | Status: AC
  Filled 2016-04-10: qty 1

## 2016-04-10 MED ORDER — ONDANSETRON HCL 4 MG/2ML IJ SOLN
INTRAMUSCULAR | Status: DC | PRN
  Administered 2016-04-10: 4 mg via INTRAVENOUS

## 2016-04-10 MED ORDER — LIDOCAINE 2% (20 MG/ML) 5 ML SYRINGE
INTRAMUSCULAR | Status: AC
  Filled 2016-04-10: qty 5

## 2016-04-10 MED ORDER — LACTATED RINGERS IV SOLN: INTRAVENOUS | Status: DC

## 2016-04-10 MED ORDER — ONDANSETRON HCL 4 MG/2ML IJ SOLN: 4.0000 mg | Freq: Four times a day (QID) | INTRAMUSCULAR | Status: DC | PRN

## 2016-04-10 MED ORDER — PROMETHAZINE HCL 12.5 MG PO TABS: 12.5000 mg | ORAL_TABLET | Freq: Four times a day (QID) | ORAL | 0 refills | Status: DC | PRN

## 2016-04-10 MED ORDER — SODIUM CHLORIDE 0.9 % IV SOLN
INTRAVENOUS | Status: DC | PRN
  Administered 2016-04-10: 40 mL

## 2016-04-10 MED ORDER — ONDANSETRON HCL 4 MG/2ML IJ SOLN
INTRAMUSCULAR | Status: AC
  Filled 2016-04-10: qty 2

## 2016-04-10 MED ORDER — ALPRAZOLAM 0.25 MG PO TABS: 0.2500 mg | ORAL_TABLET | Freq: Three times a day (TID) | ORAL | Status: DC

## 2016-04-10 MED ORDER — DEXAMETHASONE SODIUM PHOSPHATE 10 MG/ML IJ SOLN
INTRAMUSCULAR | Status: AC
  Filled 2016-04-10: qty 1

## 2016-04-10 MED ORDER — SCOPOLAMINE 1 MG/3DAYS TD PT72
MEDICATED_PATCH | TRANSDERMAL | Status: AC
  Filled 2016-04-10: qty 1

## 2016-04-10 MED ORDER — DEXAMETHASONE SODIUM PHOSPHATE 4 MG/ML IJ SOLN
INTRAMUSCULAR | Status: DC | PRN
  Administered 2016-04-10: 8 mg via INTRAVENOUS

## 2016-04-10 MED ORDER — HYDROCODONE-ACETAMINOPHEN 5-325 MG PO TABS
1.0000 | ORAL_TABLET | ORAL | Status: DC | PRN
  Administered 2016-04-10 – 2016-04-11 (×2): 1 via ORAL
  Filled 2016-04-10 (×2): qty 1

## 2016-04-10 MED ORDER — ACETAMINOPHEN 500 MG PO TABS
ORAL_TABLET | ORAL | Status: AC
  Filled 2016-04-10: qty 2

## 2016-04-10 MED ORDER — FENTANYL CITRATE (PF) 100 MCG/2ML IJ SOLN
INTRAMUSCULAR | Status: DC | PRN
  Administered 2016-04-10 (×2): 50 ug via INTRAVENOUS
  Administered 2016-04-10: 25 ug via INTRAVENOUS
  Administered 2016-04-10: 50 ug via INTRAVENOUS
  Administered 2016-04-10 (×2): 25 ug via INTRAVENOUS
  Administered 2016-04-10: 50 ug via INTRAVENOUS
  Administered 2016-04-10: 25 ug via INTRAVENOUS
  Administered 2016-04-10: 50 ug via INTRAVENOUS
  Administered 2016-04-10 (×2): 25 ug via INTRAVENOUS

## 2016-04-10 MED ORDER — HYDROCODONE-ACETAMINOPHEN 5-325 MG PO TABS: 1.0000 | ORAL_TABLET | Freq: Four times a day (QID) | ORAL | 0 refills | Status: DC | PRN

## 2016-04-10 MED ORDER — ROCURONIUM BROMIDE 50 MG/5ML IV SOSY
PREFILLED_SYRINGE | INTRAVENOUS | Status: AC
  Filled 2016-04-10: qty 5

## 2016-04-10 MED ORDER — GABAPENTIN 300 MG PO CAPS
ORAL_CAPSULE | ORAL | Status: AC
  Filled 2016-04-10: qty 1

## 2016-04-10 MED ORDER — ROCURONIUM BROMIDE 100 MG/10ML IV SOLN
INTRAVENOUS | Status: DC | PRN
  Administered 2016-04-10: 10 mg via INTRAVENOUS
  Administered 2016-04-10: 40 mg via INTRAVENOUS
  Administered 2016-04-10: 10 mg via INTRAVENOUS

## 2016-04-10 MED ORDER — KCL IN DEXTROSE-NACL 20-5-0.45 MEQ/L-%-% IV SOLN
INTRAVENOUS | Status: DC
  Administered 2016-04-10: 15:00:00 via INTRAVENOUS
  Filled 2016-04-10: qty 1000

## 2016-04-10 MED ORDER — PROMETHAZINE HCL 25 MG/ML IJ SOLN: 12.5000 mg | Freq: Four times a day (QID) | INTRAMUSCULAR | Status: DC | PRN

## 2016-04-10 MED ORDER — METHOCARBAMOL 500 MG PO TABS
500.0000 mg | ORAL_TABLET | Freq: Four times a day (QID) | ORAL | Status: DC | PRN
  Administered 2016-04-10: 500 mg via ORAL
  Filled 2016-04-10: qty 1

## 2016-04-10 MED ORDER — MIDAZOLAM HCL 2 MG/2ML IJ SOLN: 1.0000 mg | INTRAMUSCULAR | Status: DC | PRN

## 2016-04-10 MED ORDER — PANTOPRAZOLE SODIUM 40 MG PO TBEC: 40.0000 mg | DELAYED_RELEASE_TABLET | Freq: Every day | ORAL | Status: DC

## 2016-04-10 MED ORDER — PROPOFOL 10 MG/ML IV BOLUS
INTRAVENOUS | Status: DC | PRN
  Administered 2016-04-10: 130 mg via INTRAVENOUS

## 2016-04-10 MED ORDER — OXYCODONE HCL 5 MG PO TABS: 5.0000 mg | ORAL_TABLET | Freq: Once | ORAL | Status: DC | PRN

## 2016-04-10 MED ORDER — SODIUM CHLORIDE 0.9 % IJ SOLN
INTRAMUSCULAR | Status: AC
  Filled 2016-04-10: qty 20

## 2016-04-10 MED ORDER — ONDANSETRON 4 MG PO TBDP: 4.0000 mg | ORAL_TABLET | Freq: Four times a day (QID) | ORAL | Status: DC | PRN

## 2016-04-10 MED ORDER — DIPHENHYDRAMINE HCL 12.5 MG/5ML PO ELIX: 12.5000 mg | ORAL_SOLUTION | Freq: Four times a day (QID) | ORAL | Status: DC | PRN

## 2016-04-10 MED ORDER — DIPHENHYDRAMINE HCL 50 MG/ML IJ SOLN: 12.5000 mg | Freq: Four times a day (QID) | INTRAMUSCULAR | Status: DC | PRN

## 2016-04-10 MED ORDER — FENTANYL CITRATE (PF) 100 MCG/2ML IJ SOLN: 50.0000 ug | INTRAMUSCULAR | Status: DC | PRN

## 2016-04-10 MED ORDER — ACETAMINOPHEN 500 MG PO TABS
1000.0000 mg | ORAL_TABLET | ORAL | Status: AC
  Administered 2016-04-10: 1000 mg via ORAL

## 2016-04-10 MED ORDER — BISOPROLOL-HYDROCHLOROTHIAZIDE 5-6.25 MG PO TABS
1.0000 | ORAL_TABLET | Freq: Every day | ORAL | Status: DC
  Administered 2016-04-10: 1 via ORAL

## 2016-04-10 MED ORDER — CEFAZOLIN SODIUM-DEXTROSE 2-4 GM/100ML-% IV SOLN
2.0000 g | INTRAVENOUS | Status: AC
  Administered 2016-04-10 (×2): 2 g via INTRAVENOUS

## 2016-04-10 MED ORDER — PROPOFOL 10 MG/ML IV BOLUS
INTRAVENOUS | Status: AC
  Filled 2016-04-10: qty 40

## 2016-04-10 MED ORDER — HYDROMORPHONE HCL 1 MG/ML IJ SOLN: 0.2500 mg | INTRAMUSCULAR | Status: DC | PRN

## 2016-04-10 SURGICAL SUPPLY — 71 items
BAG DECANTER FOR FLEXI CONT (MISCELLANEOUS) ×3 IMPLANT
BINDER BREAST LRG (GAUZE/BANDAGES/DRESSINGS) IMPLANT
BINDER BREAST MEDIUM (GAUZE/BANDAGES/DRESSINGS) IMPLANT
BINDER BREAST XLRG (GAUZE/BANDAGES/DRESSINGS) IMPLANT
BINDER BREAST XXLRG (GAUZE/BANDAGES/DRESSINGS) ×2 IMPLANT
BLADE SURG 10 STRL SS (BLADE) ×7 IMPLANT
BLADE SURG 15 STRL LF DISP TIS (BLADE) IMPLANT
BLADE SURG 15 STRL SS (BLADE)
BNDG GAUZE ELAST 4 BULKY (GAUZE/BANDAGES/DRESSINGS) ×6 IMPLANT
CANISTER SUCT 1200ML W/VALVE (MISCELLANEOUS) ×3 IMPLANT
CHLORAPREP W/TINT 26ML (MISCELLANEOUS) ×3 IMPLANT
CLOSURE WOUND 1/2 X4 (GAUZE/BANDAGES/DRESSINGS)
COVER BACK TABLE 60X90IN (DRAPES) ×3 IMPLANT
COVER MAYO STAND STRL (DRAPES) ×3 IMPLANT
DECANTER SPIKE VIAL GLASS SM (MISCELLANEOUS) IMPLANT
DRAIN CHANNEL 15F RND FF W/TCR (WOUND CARE) ×4 IMPLANT
DRAIN CHANNEL 19F RND (DRAIN) IMPLANT
DRAPE INCISE IOBAN 66X45 STRL (DRAPES) ×2 IMPLANT
DRAPE TOP ARMCOVERS (MISCELLANEOUS) ×3 IMPLANT
DRAPE U-SHAPE 76X120 STRL (DRAPES) ×3 IMPLANT
DRSG PAD ABDOMINAL 8X10 ST (GAUZE/BANDAGES/DRESSINGS) ×6 IMPLANT
DRSG TEGADERM 2-3/8X2-3/4 SM (GAUZE/BANDAGES/DRESSINGS) IMPLANT
ELECT BLADE 4.0 EZ CLEAN MEGAD (MISCELLANEOUS)
ELECT BLADE 6.5 .24CM SHAFT (ELECTRODE) IMPLANT
ELECT COATED BLADE 2.86 ST (ELECTRODE) ×3 IMPLANT
ELECT REM PT RETURN 9FT ADLT (ELECTROSURGICAL) ×3
ELECTRODE BLDE 4.0 EZ CLN MEGD (MISCELLANEOUS) IMPLANT
ELECTRODE REM PT RTRN 9FT ADLT (ELECTROSURGICAL) ×1 IMPLANT
EVACUATOR SILICONE 100CC (DRAIN) ×4 IMPLANT
GAUZE SPONGE 4X4 12PLY STRL (GAUZE/BANDAGES/DRESSINGS) IMPLANT
GLOVE BIO SURGEON STRL SZ 6 (GLOVE) ×3 IMPLANT
GLOVE BIO SURGEON STRL SZ 6.5 (GLOVE) ×1 IMPLANT
GLOVE BIO SURGEON STRL SZ7 (GLOVE) ×4 IMPLANT
GLOVE BIO SURGEONS STRL SZ 6.5 (GLOVE) ×1
GLOVE BIOGEL PI IND STRL 7.0 (GLOVE) IMPLANT
GLOVE BIOGEL PI INDICATOR 7.0 (GLOVE) ×2
GOWN STRL REUS W/ TWL LRG LVL3 (GOWN DISPOSABLE) ×2 IMPLANT
GOWN STRL REUS W/ TWL XL LVL3 (GOWN DISPOSABLE) IMPLANT
GOWN STRL REUS W/TWL LRG LVL3 (GOWN DISPOSABLE) ×9
GOWN STRL REUS W/TWL XL LVL3 (GOWN DISPOSABLE) ×3
LIQUID BAND (GAUZE/BANDAGES/DRESSINGS) ×5 IMPLANT
NDL HYPO 25X1 1.5 SAFETY (NEEDLE) IMPLANT
NEEDLE HYPO 25X1 1.5 SAFETY (NEEDLE) ×3 IMPLANT
NS IRRIG 1000ML POUR BTL (IV SOLUTION) ×5 IMPLANT
PACK BASIN DAY SURGERY FS (CUSTOM PROCEDURE TRAY) ×3 IMPLANT
PENCIL BUTTON HOLSTER BLD 10FT (ELECTRODE) ×3 IMPLANT
PIN SAFETY STERILE (MISCELLANEOUS) ×2 IMPLANT
SHEET MEDIUM DRAPE 40X70 STRL (DRAPES) ×3 IMPLANT
SLEEVE SCD COMPRESS KNEE MED (MISCELLANEOUS) ×3 IMPLANT
SPONGE GAUZE 4X4 12PLY STER LF (GAUZE/BANDAGES/DRESSINGS) IMPLANT
SPONGE LAP 18X18 X RAY DECT (DISPOSABLE) ×12 IMPLANT
STAPLER VISISTAT 35W (STAPLE) ×7 IMPLANT
STRIP CLOSURE SKIN 1/2X4 (GAUZE/BANDAGES/DRESSINGS) IMPLANT
SUT ETHILON 2 0 FS 18 (SUTURE) ×4 IMPLANT
SUT MNCRL AB 4-0 PS2 18 (SUTURE) ×1 IMPLANT
SUT VIC AB 3-0 PS1 18 (SUTURE) ×18
SUT VIC AB 3-0 PS1 18XBRD (SUTURE) ×1 IMPLANT
SUT VIC AB 3-0 SH 27 (SUTURE)
SUT VIC AB 3-0 SH 27X BRD (SUTURE) IMPLANT
SUT VICRYL 4-0 PS2 18IN ABS (SUTURE) ×3 IMPLANT
SWAB COLLECTION DEVICE MRSA (MISCELLANEOUS) IMPLANT
SWAB CULTURE ESWAB REG 1ML (MISCELLANEOUS) IMPLANT
SYR 50ML LL SCALE MARK (SYRINGE) IMPLANT
SYR BULB IRRIGATION 50ML (SYRINGE) ×3 IMPLANT
SYR CONTROL 10ML LL (SYRINGE) ×2 IMPLANT
TAPE MEASURE VINYL STERILE (MISCELLANEOUS) ×3 IMPLANT
TOWEL OR 17X24 6PK STRL BLUE (TOWEL DISPOSABLE) ×6 IMPLANT
TUBE CONNECTING 20'X1/4 (TUBING) ×2
TUBE CONNECTING 20X1/4 (TUBING) ×3 IMPLANT
UNDERPAD 30X30 (UNDERPADS AND DIAPERS) ×6 IMPLANT
YANKAUER SUCT BULB TIP NO VENT (SUCTIONS) ×3 IMPLANT

## 2016-04-10 NOTE — Anesthesia Procedure Notes (Signed)
Procedure Name: Intubation Date/Time: 04/10/2016 7:43 AM Performed by: Rayvon Char Pre-anesthesia Checklist: Patient identified, Emergency Drugs available, Suction available and Patient being monitored Patient Re-evaluated:Patient Re-evaluated prior to inductionOxygen Delivery Method: Circle system utilized Preoxygenation: Pre-oxygenation with 100% oxygen Intubation Type: IV induction Ventilation: Mask ventilation without difficulty Laryngoscope Size: Miller and 2 Grade View: Grade II Tube type: Oral Tube size: 7.0 mm Number of attempts: 1 Airway Equipment and Method: Stylet Placement Confirmation: ETT inserted through vocal cords under direct vision,  positive ETCO2 and breath sounds checked- equal and bilateral Secured at: 22 cm Tube secured with: Tape Dental Injury: Teeth and Oropharynx as per pre-operative assessment

## 2016-04-10 NOTE — Discharge Instructions (Signed)
Post Anesthesia Home Care Instructions  Activity: Get plenty of rest for the remainder of the day. A responsible adult should stay with you for 24 hours following the procedure.  For the next 24 hours, DO NOT: -Drive a car -Paediatric nurse -Drink alcoholic beverages -Take any medication unless instructed by your physician -Make any legal decisions or sign important papers.  Meals: Start with liquid foods such as gelatin or soup. Progress to regular foods as tolerated. Avoid greasy, spicy, heavy foods. If nausea and/or vomiting occur, drink only clear liquids until the nausea and/or vomiting subsides. Call your physician if vomiting continues.  Special Instructions/Symptoms: Your throat may feel dry or sore from the anesthesia or the breathing tube placed in your throat during surgery. If this causes discomfort, gargle with warm salt water. The discomfort should disappear within 24 hours.  If you had a scopolamine patch placed behind your ear for the management of post- operative nausea and/or vomiting:  1. The medication in the patch is effective for 72 hours, after which it should be removed.  Wrap patch in a tissue and discard in the trash. Wash hands thoroughly with soap and water. 2. You may remove the patch earlier than 72 hours if you experience unpleasant side effects which may include dry mouth, dizziness or visual disturbances. 3. Avoid touching the patch. Wash your hands with soap and water after contact with the patch.   Information for Discharge Teaching: EXPAREL (bupivacaine liposome injectable suspension)   Your surgeon gave you EXPAREL(bupivacaine) in your surgical incision to help control your pain after surgery.   EXPAREL is a local anesthetic that provides pain relief by numbing the tissue around the surgical site.  EXPAREL is designed to release pain medication over time and can control pain for up to 72 hours.  Depending on how you respond to EXPAREL, you may  require less pain medication during your recovery.  Possible side effects:  Temporary loss of sensation or ability to move in the area where bupivacaine was injected.  Nausea, vomiting, constipation  Rarely, numbness and tingling in your mouth or lips, lightheadedness, or anxiety may occur.  Call your doctor right away if you think you may be experiencing any of these sensations, or if you have other questions regarding possible side effects.  Follow all other discharge instructions given to you by your surgeon or nurse. Eat a healthy diet and drink plenty of water or other fluids.  If you return to the hospital for any reason within 96 hours following the administration of EXPAREL, please inform your health care providers.Call your surgeon if you experience:   1.  Fever over 101.0. 2.  Inability to urinate. 3.  Nausea and/or vomiting. 4.  Extreme swelling or bruising at the surgical site. 5.  Continued bleeding from the incision. 6.  Increased pain, redness or drainage from the incision. 7.  Problems related to your pain medication. 8.  Any problems and/or concernsAbout my Jackson-Pratt Bulb Drain  What is a Jackson-Pratt bulb? A Jackson-Pratt is a soft, round device used to collect drainage. It is connected to a long, thin drainage catheter, which is held in place by one or two small stiches near your surgical incision site. When the bulb is squeezed, it forms a vacuum, forcing the drainage to empty into the bulb.  Emptying the Jackson-Pratt bulb- To empty the bulb: 1. Release the plug on the top of the bulb. 2. Pour the bulb's contents into a measuring container which your nurse  will provide. 3. Record the time emptied and amount of drainage. Empty the drain(s) as often as your     doctor or nurse recommends.  Date                  Time                    Amount (Drain 1)                 Amount (Drain  2)  _____________________________________________________________________  _____________________________________________________________________  _____________________________________________________________________  _____________________________________________________________________  _____________________________________________________________________  _____________________________________________________________________  _____________________________________________________________________  _____________________________________________________________________  Squeezing the Jackson-Pratt Bulb- To squeeze the bulb: 1. Make sure the plug at the top of the bulb is open. 2. Squeeze the bulb tightly in your fist. You will hear air squeezing from the bulb. 3. Replace the plug while the bulb is squeezed. 4. Use a safety pin to attach the bulb to your clothing. This will keep the catheter from     pulling at the bulb insertion site.  When to call your doctor- Call your doctor if:  Drain site becomes red, swollen or hot.  You have a fever greater than 101 degrees F.  There is oozing at the drain site.  Drain falls out (apply a guaze bandage over the drain hole and secure it with tape).  Drainage increases daily not related to activity patterns. (You will usually have more drainage when you are active than when you are resting.)  Drainage has a bad odor.      JP Drain [employer] this sheet to all of your post-operative appointments while you have your drains.  Please measure your drains by CC's or ML's.  Make sure you drain and measure your JP Drains 2 or 3 times per day.  At the end of each day, add up totals for the left side and add up totals for the right side.    ( 9 am )     ( 3 pm )        ( 9 pm )                Date L  R  L  R  L  R  Total L/R

## 2016-04-10 NOTE — H&P (Signed)
Subjective:   Patient ID: Leslie McKinnonis a 71 y.o.female.  HPI  Here for removal bilateral breast implants, rupture noted on screening MMG/US 01/2016. Surgery rescheduled due to URI which has resolved. 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 Notes 15 lb wt gain since starting tamoxifen.   History augmentation mammaplasty subglandular age 59 via periareolar approach. No information on implant size, states after initial augmentation more of a "lift"; cannot recall if size changed significantly. Current D cup. States she had augmentation due to her prior husband and has felt too large.   Genetics negative for BRCA1/2.  CLINICAL DATA: 71 year old female presenting for annual evaluation status post left breast lumpectomy in 2013. The patient has history of bilateral breast augmentation in approximately 1980.  EXAM: 2D DIGITAL DIAGNOSTIC BILATERAL MAMMOGRAM WITH IMPLANTS, CAD AND ADJUNCT TOMO  The patient has retropectoral implants. Standard and implant displaced views were performed.  BILATERAL BREAST ULTRASOUND  COMPARISON: Previous exam(s).  ACR Breast Density Category b: There are scattered areas of fibroglandular density.  FINDINGS: The left breast lumpectomy site is stable. No suspicious calcifications, masses or areas of distortion are seen in the bilateral breasts. There are contour abnormalities of the implants bilaterally suggestive of extracapsular silicone.  Mammographic images were processed with CAD.  Ultrasound of the bilateral breasts demonstrates multiple areas of snowstorm appearance compatible with extracapsular rupture.  IMPRESSION: 1. No mammographic evidence of malignancy in the bilateral breasts.  2. Mammographic and sonographic evidence of extracapsular implant rupture.  RECOMMENDATION: 1. Diagnostic mammogram is suggested in 1 year. (Code:DM-B-01Y).  2.  Surgical consultation for bilateral implant rupture.  I have discussed the findings and recommendations with the patient. Results were also provided in writing at the conclusion of the visit. If applicable, a reminder letter will be sent to the patient regarding the next appointment.  BI-RADS CATEGORY 2: Benign.   Electronically Signed By: Ammie Ferrier M.D. On: 01/31/2016 12:45   Objective:  Physical Exam  Cardiovascular: Normal rate. Normal heart sounds Pulmonary/Chest: Effort normal. Clear to auscultation Lymphadenopathy:  She has no axillary adenopathy.  Neurological: She is alertand oriented to person, place, and time.   Grade 2 ptosis bilateral, Baker 3 on right Baker 4 left Telangiectasias at left IMF, left NAC scar from 1 to 6 to 9 o clock 5-6 cm soft tissue pinch over both implants, no animation SN to nipple R 29.5 L 28 cm BW R 18 L 18 cm Nipple to IMF R 13 L 12 cm   Assessment:   History left breast ca s/p lumpectomy S/p augmentation mammaplasty with bilateral extracapsular silicone rupture  Plan:   Recommend surgery to remove implants and implant material as the extracapsular rupture may progress and free silicone in breast tissue can produce nodularity/granulomas in breast tissue that will be difficult to follow from cancer standpoint. Given bilateral contracture recommend bilateral capsulectomies. Recommend IMF approach to address this. She has bilateral ptosis and recommend mastopexy. Reviewed anchor type scars, possible drains. We discussed that history of radiation on her left puts her at higher risk recurrent capsular contracture and wound healing problems. Discussed OP surgery. Discussed her current contracture likely due both to rupture and history XRT.  We have discussed implants subglandular. She confirmed this. We have discussed options replace or not to replace implants and plan not to replace implants at this time.    Irene Limbo, MD [facility] Plastic & Reconstructive Surgery [phone], pin [phone]

## 2016-04-10 NOTE — Op Note (Signed)
Operative Note   DATE OF OPERATION: 3.9.18  LOCATION: [facility]-outpatient  SURGICAL DIVISION: Plastic Surgery  PREOPERATIVE DIAGNOSES:  1. Bilateral extracapsular rupture subglandular silicone implants 2. Bilateral capsular contracture 3. History left breast cancer (DCIS) 4. History therapeutic radiation left chest  POSTOPERATIVE DIAGNOSES:  same  PROCEDURE:  1. Removal bilateral ruptured implants and implant material 2. Bilateral complete capsulectomy 3. Bilateral mastopexy  SURGEON: Irene Limbo MD MBA  ASSISTANT: none  ANESTHESIA:  General.   EBL: 50 ml  COMPLICATIONS: Left nipple areolar complex appeared congested intraoperatively and closure removed and observed. Simple loose interrupted skin closure of left NAC inset completed.   INDICATIONS FOR PROCEDURE:  The patient, Leslie Love, is a 71 y.o. female born on Aug 31, 1945, is here for removal bilateral ruptured subglandular gel implants. She has a history of left breast lumpectomy with adjuvant radiation for DCIS of left UOQ noted to be retroareolar. Her augmentation was done via periareolar approach, and her left lumpectomy was also completed via periareolar approach.   FINDINGS: Thick calcified capsule noted bilateral. By volume displacement, approximate volume of right breast implant and capsule 250 ml. Left nipple areolar complex appeared congested intraoperatively and closure removed. Simple loose interrupted skin closure of left NAC inset completed. Left breast mastopexy 97 g Right breast mastopexy 121 g.   DESCRIPTION OF PROCEDURE:  Sternal notch, chest midline, anterior axillary lines were marked with the patient in the preoperative area. Inframammary fold was marked on anterior surface of each breast.The patient was taken to the operating room. SCDs were placed and IV antibiotics were given. The patient's operative site was prepped and draped in a sterile fashion. A time out was performed and all information  was confirmed to be correct. Incision made in right inframammary fold and carried through superficial fascia and breast tissue to implant capsule. Capsule noted to be thick and calcified. Dissection of glandular tissue completed off anterior surface capsule and capsule dissected from posterior pectoralis surface. Capsule and implant able to be resected en block with minor silicone leakage from capsule. By volume displacement, implant and capsule together 250 ml.  I then made incision in left inframammary fold. Similar thick calcified capsule noted. During course of dissection on left, capsule entered and gross silicone gel was removed in pieces. Remainder of capsule removed in entirety and cavity irrigated with saline.   The patient was brought to upright sitting position and the inframammary fold position on anterior surface of breast marked symmetric. Wise pattern skin excision marked for mastopexy with 10 cm limbs from nipple to IMF marked. The patient was returned to supine position. The NAC were each marked with 42 mm cookie cutter and superior-medial  pedicle designed bilateral. The pedicle was deepithelialized on each side. The medial and lateral skin flaps dissected until tension free closure over pedicle could be obtained. The remainder of incisions incised and medial and lateral triangles of soft tissue excised at inframammary fold. The patient was tailor tacked and brought to upright sitting position. 42 mm cookie cutter used to mark area for NAC inset and this tissue was excised bilateral. The patient was returned to supine position and each cavity irrigated and hemostasis ensured. Exparel infiltrated throughout bilateral breast incisions and chest wall. 15 Fr JP placed in each breast and secured with 2-0 nylon. The breast were tailor tacked closed. The left NAC was noted to be venous congested and all staples removed on this side. Congestion improved with observation and closure progressed. Closure  completed with  3-0 vicryl to approximate dermis along inframammary fold and vertical limb. NAC inset with 4-0 vicryl in dermis. Skin closure completed with 4-0 monocryl subcuticular throughout. At this time I felt the left NAC had recurrent congestion and all sutures of left NAC inset were removed. I completed right breast closure in similar fashion. Examination of left NAC at this time was improved color with continued congested blood draining from dermis. Simple interrupted 4-0 monocryl used to loosely approximate NAC to skin flaps at 12, 3,6, and 9 o clock with no change in nipple areola color.  Tissue adhesive applied to all incisions with exception left NAC inset. Adaptic was placed over left NAC. Dry dressing and breast binder applied.   The patient was allowed to wake from anesthesia, extubated and taken to the recovery room in satisfactory condition.   SPECIMENS: 1. Right implant and capsule 2. Left implant and capsule 3. Left mastopexy 4. Right mastopexy  DRAINS: 15 Fr JP in right and left breast  Irene Limbo, MD [facility] Plastic & Reconstructive Surgery [phone], pin [phone]

## 2016-04-10 NOTE — Transfer of Care (Signed)
Immediate Anesthesia Transfer of Care Note  Patient: Leslie Love  Procedure(s) Performed: Procedure(s): REMOVAL BILATERAL  BREAST IMPLANTS AND IMPLANT MATERIAL (Bilateral) MASTOPEXY (Bilateral) CAPSULECTOMY (Bilateral)  Patient Location: PACU  Anesthesia Type:General  Level of Consciousness: awake, alert  and oriented  Airway & Oxygen Therapy: Patient Spontanous Breathing and Patient connected to face mask oxygen  Post-op Assessment: Report given to RN and Post -op Vital signs reviewed and stable  Post vital signs: Reviewed and stable  Last Vitals:  Vitals:   04/10/16 0727  BP: 132/67  Pulse: 85  Resp: 18  Temp: 36.5 C    Last Pain:  Vitals:   04/10/16 0727  TempSrc: Oral  PainSc: 0-No pain         Complications: No apparent anesthesia complications

## 2016-04-10 NOTE — Anesthesia Preprocedure Evaluation (Signed)
Anesthesia Evaluation  Patient identified by MRN, date of birth, ID band Patient awake    Reviewed: Allergy & Precautions, H&P , NPO status , Patient's Chart, lab work & pertinent test results  History of Anesthesia Complications (+) PONVNegative for: history of anesthetic complications  Airway Mallampati: III  TM Distance: <3 FB Neck ROM: limited    Dental  (+) Poor Dentition, Chipped   Pulmonary neg shortness of breath, former smoker,    Pulmonary exam normal breath sounds clear to auscultation       Cardiovascular Exercise Tolerance: Good (-) angina(-) Past MI and (-) DOE Normal cardiovascular exam Rhythm:regular Rate:Normal     Neuro/Psych negative neurological ROS  negative psych ROS   GI/Hepatic Neg liver ROS, GERD  Medicated and Controlled,  Endo/Other  negative endocrine ROS  Renal/GU negative Renal ROS  negative genitourinary   Musculoskeletal   Abdominal   Peds  Hematology negative hematology ROS (+)   Anesthesia Other Findings Past Medical History: No date: Anemia 2013: Breast cancer ([place])     Comment: DCIS No date: Cancer ([place])     Comment: Left breast cancer 2014, lumpectomy No date: Chronic insomnia No date: GERD (gastroesophageal reflux disease) No date: History of gastritis No date: History of shingles No date: Hyperlipidemia No date: Hypertension No date: Nocturnal leg cramps No date: Osteopenia No date: Osteopenia No date: Palpitations No date: Vitamin B 12 deficiency No date: Vitamin D deficiency  Past Surgical History: No date: ABDOMINAL HYSTERECTOMY No date: APPENDECTOMY 1980: AUGMENTATION MAMMAPLASTY Bilateral 2013: BREAST BIOPSY Left     Comment: DCIS 2013: BREAST LUMPECTOMY Left No date: BREAST LUMPECTOMY W/ NEEDLE LOCALIZATION Left No date: BREAST SURGERY No date: COLONOSCOPY 12/31/2014: COLONOSCOPY N/A     Comment: Procedure: COLONOSCOPY;  Surgeon: Hulen Luster,        MD;  Location: [facility] ENDOSCOPY;  Service:               Gastroenterology;  Laterality: N/A; No date: ESOPHAGOGASTRODUODENOSCOPY No date: KNEE ARTHROSCOPY Left     Comment: partial medial and lateral menisceoctomy  BMI    Body Mass Index:  29.01 kg/m      Reproductive/Obstetrics negative OB ROS                             Anesthesia Physical  Anesthesia Plan  ASA: II  Anesthesia Plan: General   Post-op Pain Management:    Induction: Intravenous  Airway Management Planned: LMA  Additional Equipment:   Intra-op Plan:   Post-operative Plan:   Informed Consent: I have reviewed the patients History and Physical, chart, labs and discussed the procedure including the risks, benefits and alternatives for the proposed anesthesia with the patient or authorized representative who has indicated his/her understanding and acceptance.   Dental Advisory Given  Plan Discussed with: CRNA and Surgeon  Anesthesia Plan Comments:         Anesthesia Quick Evaluation

## 2016-04-10 NOTE — Anesthesia Postprocedure Evaluation (Signed)
Anesthesia Post Note  Patient: Leslie Love  Procedure(s) Performed: Procedure(s) (LRB): REMOVAL BILATERAL  BREAST IMPLANTS AND IMPLANT MATERIAL (Bilateral) MASTOPEXY (Bilateral) CAPSULECTOMY (Bilateral)  Patient location during evaluation: PACU Anesthesia Type: General Level of consciousness: awake and alert Pain management: pain level controlled Vital Signs Assessment: post-procedure vital signs reviewed and stable Respiratory status: spontaneous breathing, nonlabored ventilation and respiratory function stable Cardiovascular status: blood pressure returned to baseline and stable Postop Assessment: no signs of nausea or vomiting Anesthetic complications: no       Last Vitals:  Vitals:   04/10/16 1220 04/10/16 1230  BP: (!) 143/77 (!) 119/49  Pulse: (!) 104 93  Resp: (!) 21 11  Temp: 36.9 C     Last Pain:  Vitals:   04/10/16 1230  TempSrc:   PainSc: 2                  Lynda Rainwater

## 2016-04-10 NOTE — Progress Notes (Signed)
Patient with nausea and kept for overnight stay.  Notes nausea improved if she does not move.   PE: left NAC with hyperemia, very brisk cap refill Removed all but 1 skin suture of NAC at this time and covered with dry dressing  Communicated findings with patient.  Irene Limbo, MD [facility] Plastic & Reconstructive Surgery [phone], pin [phone]

## 2016-04-11 DIAGNOSIS — T8543XA Leakage of breast prosthesis and implant, initial encounter: Secondary | ICD-10-CM | POA: Diagnosis not present

## 2016-04-11 NOTE — Discharge Summary (Signed)
Physician Discharge Summary  Patient ID: Leslie Love MRN: [id number] DOB/AGE: 08/26/1945 71 y.o.  Admit date: 04/10/2016 Discharge date: 04/11/2016  Admission Diagnoses: History breast cancer, bilateral ruptured silicone implants, capsular contacture bilateral, history therapeutic radiation  Discharge Diagnoses:  same  Discharged Condition: stable  Hospital Course: Patient noted to have venous congestion left NAC intraoperatively and majority NAC inset sutures removed. Patient observed overnight ofr pain and nausea. Continues to have venous congestion left NAC on POD#1 and <1 sec cap refill. Has scheduled follow up in 5 days. Discussed intraoperative and current findings with patient. Plan no compression, observation. Dry dressing as needed.  Treatments: surgery: bilateral removal ruptured implants, capsulectomies, mastopexies  Discharge Exam: Blood pressure 126/64, pulse 86, temperature 97 F (36.1 C), resp. rate 18, height 5\' 4"  (1.626 m), weight 79.4 kg (175 lb 2 oz), SpO2 100 %. Incision/Wound: right breast benign with incisions dry, drains serosanguinous, breasts bilateral soft, left NAC with open wound and hyperemia, venous congestions, dark blood drains from dermal edges NAC  Disposition: 01-Home or Self Care  Discharge Instructions    Call MD for:  redness, tenderness, or signs of infection (pain, swelling, bleeding, redness, odor or green/yellow discharge around incision site)    Complete by:  As directed    Call MD for:  temperature >100.5    Complete by:  As directed    Discharge instructions    Complete by:  As directed    Ok to remove dressings and shower am 3.11.18. Soap and water ok, pat incisions dry. No creams or ointments over incisions. Do not let drains dangle in shower, attach to lanyard or similar.Strip and record drains twice daily and bring log to clinic visit.  Breast binder or soft compression bra all other times. You will have bloody drainage from left  areola as this was left partially open. If any dressings area stuck, may get in shower and wet to remove. Cover with dry dressing as needed.  Ok to raise arms above shoulders for bathing and dressing.  No house yard work or exercise until cleared by MD.   Driving Restrictions    Complete by:  As directed    No driving if taking narcotics   Lifting restrictions    Complete by:  As directed    No lifting greater than 5 lbs for 2 weeks   Resume previous diet    Complete by:  As directed       Follow-up Information    Dontea Corlew, MD In 1 week.   Specialty:  Plastic Surgery Why:  as scheduled Contact information: [address] [place] [address] [phone]           Signed: Irene Limbo 04/11/2016, 8:03 AM

## 2016-04-13 ENCOUNTER — Encounter (HOSPITAL_BASED_OUTPATIENT_CLINIC_OR_DEPARTMENT_OTHER): Payer: Self-pay | Admitting: Plastic Surgery

## 2016-04-17 ENCOUNTER — Encounter (HOSPITAL_BASED_OUTPATIENT_CLINIC_OR_DEPARTMENT_OTHER)
Admission: RE | Admit: 2016-04-17 | Discharge: 2016-04-17 | Disposition: A | Discharge status: Home / Self Care | Payer: Medicare Other | Source: Ambulatory Visit | Attending: Plastic Surgery | Admitting: Plastic Surgery

## 2016-04-17 ENCOUNTER — Encounter (HOSPITAL_BASED_OUTPATIENT_CLINIC_OR_DEPARTMENT_OTHER): Payer: Self-pay | Admitting: *Deleted

## 2016-04-17 LAB — BASIC METABOLIC PANEL
ANION GAP: 10 (ref 5–15)
BUN: 8 mg/dL (ref 6–20)
CO2: 27 mmol/L (ref 22–32)
Calcium: 8.9 mg/dL (ref 8.9–10.3)
Chloride: 99 mmol/L — ABNORMAL LOW (ref 101–111)
Creatinine, Ser: 0.83 mg/dL (ref 0.44–1.00)
GFR calc Af Amer: >60 mL/min (ref >60)
GLUCOSE: 131 mg/dL — AB (ref 65–99)
Potassium: 3.9 mmol/L (ref 3.5–5.1)
SODIUM: 136 mmol/L (ref 135–145)

## 2016-04-17 NOTE — H&P (Signed)
  Subjective:     Patient ID: Leslie Love is a 71 y.o. female.  HPI   1 week post op bilateral explant ruptured silicone implants, capsulectomies, and mastopexies. Patient developed congestion left NAC intraoperatively and nealy all NAC inset sutures removed.   Pathology from above surgery benign.  Presented with bilateral breast implant rupture noted on recent screening MMG/US 01/2016. 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   History augmentation mammaplasty subglandular age 49 via periareolar approach.  Prior to last surgery D cup. States she had augmentation due to her prior husband and has felt too large.  Review of Systems    Objective:   Physical Exam  Cardiovascular: Normal rate, regular rhythm and normal heart sounds.   Pulmonary/Chest: Effort normal and breath sounds normal.   Chest right breast incisions intact, benign, resolving edema and ecchymoses Left breast no cellulitis no drainage from NAC-this is granulating at edges, NAC with dried eschar at edges, and deep purple color through out remainder NAC, able to get derrmal bleeding scattered NAC   Assessment:     History left breast ca s/p lumpectomy S/p augmentation mammaplasty with bilateral extracapsular silicone rupture S/p bilateral explant, capsulectomies, mastopexies    Plan:     Copy of pathology provided. Plan debridement left NAC. Reviewed I do not suspect any of NAC will be salvageable. Concern also for underlying breast tissue- may require debridement of this, possible drain placement. This could lead to significant asymmetry breast volume, significant deformity or depression breast.  Will attempt to close wound primarily but may require dressing changes. Given radiation history, this may lead to prolonged wound healing.   Irene Limbo, MD [facility] Plastic & Reconstructive Surgery [phone], pin [phone]

## 2016-04-17 NOTE — Progress Notes (Signed)
Pt. Given boost drink with instructions to have completed by 0900 hrs morning of surgery. NPO otherwise.  Pt verbalized understanding.

## 2016-04-20 ENCOUNTER — Encounter (HOSPITAL_BASED_OUTPATIENT_CLINIC_OR_DEPARTMENT_OTHER): Payer: Self-pay | Admitting: Anesthesiology

## 2016-04-20 ENCOUNTER — Ambulatory Visit (HOSPITAL_BASED_OUTPATIENT_CLINIC_OR_DEPARTMENT_OTHER): Payer: Medicare Other | Admitting: Anesthesiology

## 2016-04-20 ENCOUNTER — Encounter (HOSPITAL_BASED_OUTPATIENT_CLINIC_OR_DEPARTMENT_OTHER)
Admission: RE | Disposition: A | Discharge status: Home / Self Care | Payer: Self-pay | Source: Ambulatory Visit | Attending: Plastic Surgery

## 2016-04-20 ENCOUNTER — Ambulatory Visit (HOSPITAL_BASED_OUTPATIENT_CLINIC_OR_DEPARTMENT_OTHER)
Admission: RE | Admit: 2016-04-20 | Discharge: 2016-04-20 | Disposition: A | Discharge status: Home / Self Care | Payer: Medicare Other | Source: Ambulatory Visit | Attending: Plastic Surgery | Admitting: Plastic Surgery

## 2016-04-20 DIAGNOSIS — Z87891 Personal history of nicotine dependence: Secondary | ICD-10-CM | POA: Diagnosis not present

## 2016-04-20 DIAGNOSIS — E785 Hyperlipidemia, unspecified: Secondary | ICD-10-CM | POA: Insufficient documentation

## 2016-04-20 DIAGNOSIS — K219 Gastro-esophageal reflux disease without esophagitis: Secondary | ICD-10-CM | POA: Diagnosis not present

## 2016-04-20 DIAGNOSIS — I10 Essential (primary) hypertension: Secondary | ICD-10-CM | POA: Insufficient documentation

## 2016-04-20 DIAGNOSIS — N641 Fat necrosis of breast: Secondary | ICD-10-CM | POA: Insufficient documentation

## 2016-04-20 DIAGNOSIS — Z853 Personal history of malignant neoplasm of breast: Secondary | ICD-10-CM | POA: Diagnosis not present

## 2016-04-20 DIAGNOSIS — M8580 Other specified disorders of bone density and structure, unspecified site: Secondary | ICD-10-CM | POA: Insufficient documentation

## 2016-04-20 HISTORY — PX: INCISION AND DRAINAGE OF WOUND: SHX1803

## 2016-04-20 SURGERY — IRRIGATION AND DEBRIDEMENT WOUND
Anesthesia: General | Site: Breast | Laterality: Left

## 2016-04-20 MED ORDER — PHENYLEPHRINE HCL 10 MG/ML IJ SOLN
INTRAMUSCULAR | Status: DC | PRN
  Administered 2016-04-20 (×2): 80 ug via INTRAVENOUS

## 2016-04-20 MED ORDER — FENTANYL CITRATE (PF) 100 MCG/2ML IJ SOLN
INTRAMUSCULAR | Status: AC
  Filled 2016-04-20: qty 2

## 2016-04-20 MED ORDER — DEXAMETHASONE SODIUM PHOSPHATE 10 MG/ML IJ SOLN
INTRAMUSCULAR | Status: AC
  Filled 2016-04-20: qty 1

## 2016-04-20 MED ORDER — PROPOFOL 10 MG/ML IV BOLUS
INTRAVENOUS | Status: DC | PRN
  Administered 2016-04-20: 200 mg via INTRAVENOUS

## 2016-04-20 MED ORDER — SCOPOLAMINE 1 MG/3DAYS TD PT72: 1.0000 | MEDICATED_PATCH | Freq: Once | TRANSDERMAL | Status: DC | PRN

## 2016-04-20 MED ORDER — CHLORHEXIDINE GLUCONATE CLOTH 2 % EX PADS: 6.0000 | MEDICATED_PAD | Freq: Once | CUTANEOUS | Status: DC

## 2016-04-20 MED ORDER — FENTANYL CITRATE (PF) 100 MCG/2ML IJ SOLN
50.0000 ug | INTRAMUSCULAR | Status: AC | PRN
  Administered 2016-04-20: 25 ug via INTRAVENOUS
  Administered 2016-04-20 (×2): 50 ug via INTRAVENOUS

## 2016-04-20 MED ORDER — PROMETHAZINE HCL 25 MG/ML IJ SOLN: 6.2500 mg | INTRAMUSCULAR | Status: DC | PRN

## 2016-04-20 MED ORDER — LACTATED RINGERS IV SOLN
INTRAVENOUS | Status: DC
  Administered 2016-04-20 (×2): via INTRAVENOUS

## 2016-04-20 MED ORDER — ACETAMINOPHEN 500 MG PO TABS
ORAL_TABLET | ORAL | Status: AC
  Filled 2016-04-20: qty 2

## 2016-04-20 MED ORDER — GABAPENTIN 300 MG PO CAPS
300.0000 mg | ORAL_CAPSULE | ORAL | Status: AC
  Administered 2016-04-20: 300 mg via ORAL

## 2016-04-20 MED ORDER — BUPIVACAINE HCL (PF) 0.25 % IJ SOLN
INTRAMUSCULAR | Status: AC
  Filled 2016-04-20: qty 30

## 2016-04-20 MED ORDER — PHENYLEPHRINE 40 MCG/ML (10ML) SYRINGE FOR IV PUSH (FOR BLOOD PRESSURE SUPPORT)
PREFILLED_SYRINGE | INTRAVENOUS | Status: AC
Start: 2016-04-20 — End: 2016-04-20
  Filled 2016-04-20: qty 10

## 2016-04-20 MED ORDER — HYDROMORPHONE HCL 1 MG/ML IJ SOLN
0.2500 mg | INTRAMUSCULAR | Status: DC | PRN
  Administered 2016-04-20: 0.5 mg via INTRAVENOUS

## 2016-04-20 MED ORDER — CEFAZOLIN SODIUM-DEXTROSE 2-4 GM/100ML-% IV SOLN
INTRAVENOUS | Status: AC
  Filled 2016-04-20: qty 100

## 2016-04-20 MED ORDER — LIDOCAINE HCL (CARDIAC) 20 MG/ML IV SOLN
INTRAVENOUS | Status: DC | PRN
  Administered 2016-04-20 (×2): 30 mg via INTRAVENOUS

## 2016-04-20 MED ORDER — MIDAZOLAM HCL 2 MG/2ML IJ SOLN: 1.0000 mg | INTRAMUSCULAR | Status: DC | PRN

## 2016-04-20 MED ORDER — PROPOFOL 10 MG/ML IV BOLUS
INTRAVENOUS | Status: AC
  Filled 2016-04-20: qty 20

## 2016-04-20 MED ORDER — PHENYLEPHRINE 40 MCG/ML (10ML) SYRINGE FOR IV PUSH (FOR BLOOD PRESSURE SUPPORT)
PREFILLED_SYRINGE | INTRAVENOUS | Status: AC
  Filled 2016-04-20: qty 10

## 2016-04-20 MED ORDER — CEFAZOLIN SODIUM-DEXTROSE 2-4 GM/100ML-% IV SOLN
2.0000 g | INTRAVENOUS | Status: AC
  Administered 2016-04-20: 2 g via INTRAVENOUS

## 2016-04-20 MED ORDER — ACETAMINOPHEN 500 MG PO TABS
1000.0000 mg | ORAL_TABLET | ORAL | Status: AC
  Administered 2016-04-20: 1000 mg via ORAL

## 2016-04-20 MED ORDER — GABAPENTIN 300 MG PO CAPS
ORAL_CAPSULE | ORAL | Status: AC
  Filled 2016-04-20: qty 1

## 2016-04-20 MED ORDER — 0.9 % SODIUM CHLORIDE (POUR BTL) OPTIME
TOPICAL | Status: DC | PRN
  Administered 2016-04-20: 600 mL

## 2016-04-20 MED ORDER — DEXAMETHASONE SODIUM PHOSPHATE 4 MG/ML IJ SOLN
INTRAMUSCULAR | Status: DC | PRN
  Administered 2016-04-20: 10 mg via INTRAVENOUS

## 2016-04-20 MED ORDER — HYDROMORPHONE HCL 1 MG/ML IJ SOLN
INTRAMUSCULAR | Status: AC
  Filled 2016-04-20: qty 1

## 2016-04-20 MED ORDER — ONDANSETRON HCL 4 MG/2ML IJ SOLN
INTRAMUSCULAR | Status: DC | PRN
  Administered 2016-04-20: 4 mg via INTRAVENOUS

## 2016-04-20 SURGICAL SUPPLY — 59 items
BAG DECANTER FOR FLEXI CONT (MISCELLANEOUS) ×1 IMPLANT
BINDER BREAST XLRG (GAUZE/BANDAGES/DRESSINGS) ×2 IMPLANT
BLADE HEX COATED 2.75 (ELECTRODE) ×1 IMPLANT
BLADE SURG 10 STRL SS (BLADE) ×2 IMPLANT
BLADE SURG 15 STRL LF DISP TIS (BLADE) IMPLANT
BLADE SURG 15 STRL SS (BLADE) ×3
BNDG GAUZE ELAST 4 BULKY (GAUZE/BANDAGES/DRESSINGS) IMPLANT
CANISTER SUCT 1200ML W/VALVE (MISCELLANEOUS) ×3 IMPLANT
CHLORAPREP W/TINT 26ML (MISCELLANEOUS) ×2 IMPLANT
COVER BACK TABLE 60X90IN (DRAPES) ×3 IMPLANT
COVER MAYO STAND STRL (DRAPES) ×3 IMPLANT
DECANTER SPIKE VIAL GLASS SM (MISCELLANEOUS) IMPLANT
DRAIN CHANNEL 15F RND FF W/TCR (WOUND CARE) ×2 IMPLANT
DRAPE LAPAROSCOPIC ABDOMINAL (DRAPES) ×3 IMPLANT
DRAPE SURG 17X23 STRL (DRAPES) ×2 IMPLANT
DRAPE U-SHAPE 76X120 STRL (DRAPES) IMPLANT
DRSG ADAPTIC 3X8 NADH LF (GAUZE/BANDAGES/DRESSINGS) ×2 IMPLANT
DRSG EMULSION OIL 3X3 NADH (GAUZE/BANDAGES/DRESSINGS) ×2 IMPLANT
DRSG PAD ABDOMINAL 8X10 ST (GAUZE/BANDAGES/DRESSINGS) ×2 IMPLANT
ELECT COATED BLADE 2.86 ST (ELECTRODE) ×2 IMPLANT
ELECT REM PT RETURN 9FT ADLT (ELECTROSURGICAL) ×3
ELECTRODE REM PT RTRN 9FT ADLT (ELECTROSURGICAL) ×1 IMPLANT
EVACUATOR SILICONE 100CC (DRAIN) ×2 IMPLANT
GAUZE SPONGE 4X4 12PLY STRL (GAUZE/BANDAGES/DRESSINGS) ×2 IMPLANT
GAUZE SPONGE 4X4 12PLY STRL LF (GAUZE/BANDAGES/DRESSINGS) IMPLANT
GLOVE BIO SURGEON STRL SZ 6 (GLOVE) ×3 IMPLANT
GLOVE BIO SURGEON STRL SZ 6.5 (GLOVE) IMPLANT
GLOVE BIO SURGEONS STRL SZ 6.5 (GLOVE)
GLOVE BIOGEL PI IND STRL 7.0 (GLOVE) IMPLANT
GLOVE BIOGEL PI INDICATOR 7.0 (GLOVE) ×2
GLOVE ECLIPSE 6.5 STRL STRAW (GLOVE) ×2 IMPLANT
GLOVE EXAM NITRILE EXT CUFF MD (GLOVE) ×2 IMPLANT
GOWN STRL REUS W/ TWL LRG LVL3 (GOWN DISPOSABLE) ×2 IMPLANT
GOWN STRL REUS W/TWL LRG LVL3 (GOWN DISPOSABLE) ×6
LIQUID BAND (GAUZE/BANDAGES/DRESSINGS) ×2 IMPLANT
NDL HYPO 25X1 1.5 SAFETY (NEEDLE) IMPLANT
NEEDLE HYPO 25X1 1.5 SAFETY (NEEDLE) IMPLANT
NS IRRIG 1000ML POUR BTL (IV SOLUTION) ×2 IMPLANT
PACK BASIN DAY SURGERY FS (CUSTOM PROCEDURE TRAY) ×3 IMPLANT
PENCIL BUTTON HOLSTER BLD 10FT (ELECTRODE) ×3 IMPLANT
PIN SAFETY STERILE (MISCELLANEOUS) ×2 IMPLANT
SLEEVE SCD COMPRESS KNEE MED (MISCELLANEOUS) ×2 IMPLANT
SPONGE LAP 18X18 X RAY DECT (DISPOSABLE) ×5 IMPLANT
STAPLER VISISTAT 35W (STAPLE) ×2 IMPLANT
SUT ETHILON 2 0 FS 18 (SUTURE) ×2 IMPLANT
SUT ETHILON 4 0 PS 2 18 (SUTURE) ×4 IMPLANT
SUT MNCRL AB 3-0 PS2 18 (SUTURE) ×4 IMPLANT
SUT MNCRL AB 4-0 PS2 18 (SUTURE) IMPLANT
SUT VIC AB 3-0 FS2 27 (SUTURE) IMPLANT
SUT VICRYL 4-0 PS2 18IN ABS (SUTURE) ×1 IMPLANT
SWAB COLLECTION DEVICE MRSA (MISCELLANEOUS) IMPLANT
SWAB CULTURE ESWAB REG 1ML (MISCELLANEOUS) IMPLANT
SYR BULB IRRIGATION 50ML (SYRINGE) ×3 IMPLANT
SYR CONTROL 10ML LL (SYRINGE) IMPLANT
TOWEL OR 17X24 6PK STRL BLUE (TOWEL DISPOSABLE) ×6 IMPLANT
TUBE CONNECTING 20'X1/4 (TUBING) ×1
TUBE CONNECTING 20X1/4 (TUBING) ×2 IMPLANT
UNDERPAD 30X30 (UNDERPADS AND DIAPERS) ×4 IMPLANT
YANKAUER SUCT BULB TIP NO VENT (SUCTIONS) ×3 IMPLANT

## 2016-04-20 NOTE — Interval H&P Note (Signed)
History and Physical Interval Note:  04/20/2016 10:57 AM  Leslie Love  has presented today for surgery, with the diagnosis of LEFT WOUND TO THE OPEN BREAST, HISTORY OF BREAST CANCER  The various methods of treatment have been discussed with the patient and family. After consideration of risks, benefits and other options for treatment, the patient has consented to  Procedure(s): IRRIGATION AND DEBRIDEMENT OF LEFT BREAST (Left) as a surgical intervention .  The patient's history has been reviewed, patient examined, no change in status, stable for surgery.  I have reviewed the patient's chart and labs.  Questions were answered to the patient's satisfaction.     Jilliane Kazanjian

## 2016-04-20 NOTE — Anesthesia Procedure Notes (Signed)
Procedure Name: LMA Insertion Date/Time: 04/20/2016 12:03 PM Performed by: Melynda Ripple D Pre-anesthesia Checklist: Patient identified, Emergency Drugs available, Suction available and Patient being monitored Patient Re-evaluated:Patient Re-evaluated prior to inductionOxygen Delivery Method: Circle system utilized Preoxygenation: Pre-oxygenation with 100% oxygen Intubation Type: IV induction Ventilation: Mask ventilation without difficulty LMA: LMA inserted LMA Size: 3.0 Number of attempts: 1 Airway Equipment and Method: Bite block Placement Confirmation: positive ETCO2 Tube secured with: Tape Dental Injury: Teeth and Oropharynx as per pre-operative assessment

## 2016-04-20 NOTE — Discharge Instructions (Signed)
Post Anesthesia Home Care Instructions  Activity: Get plenty of rest for the remainder of the day. A responsible adult should stay with you for 24 hours following the procedure.  For the next 24 hours, DO NOT: -Drive a car -Paediatric nurse -Drink alcoholic beverages -Take any medication unless instructed by your physician -Make any legal decisions or sign important papers.  Meals: Start with liquid foods such as gelatin or soup. Progress to regular foods as tolerated. Avoid greasy, spicy, heavy foods. If nausea and/or vomiting occur, drink only clear liquids until the nausea and/or vomiting subsides. Call your physician if vomiting continues.  Special Instructions/Symptoms: Your throat may feel dry or sore from the anesthesia or the breathing tube placed in your throat during surgery. If this causes discomfort, gargle with warm salt water. The discomfort should disappear within 24 hours.  If you had a scopolamine patch placed behind your ear for the management of post- operative nausea and/or vomiting:  1. The medication in the patch is effective for 72 hours, after which it should be removed.  Wrap patch in a tissue and discard in the trash. Wash hands thoroughly with soap and water. 2. You may remove the patch earlier than 72 hours if you experience unpleasant side effects which may include dry mouth, dizziness or visual disturbances. 3. Avoid touching the patch. Wash your hands with soap and water after contact with the patch.   Call your surgeon if you experience:   1.  Fever over 101.0. 2.  Inability to urinate. 3.  Nausea and/or vomiting. 4.  Extreme swelling or bruising at the surgical site. 5.  Continued bleeding from the incision. 6.  Increased pain, redness or drainage from the incision. 7.  Problems related to your pain medication. 8.  Any problems and/or concerns   About my Jackson-Pratt Bulb Drain  What is a Jackson-Pratt bulb? A Jackson-Pratt is a soft, round  device used to collect drainage. It is connected to a long, thin drainage catheter, which is held in place by one or two small stiches near your surgical incision site. When the bulb is squeezed, it forms a vacuum, forcing the drainage to empty into the bulb.  Emptying the Jackson-Pratt bulb- To empty the bulb: 1. Release the plug on the top of the bulb. 2. Pour the bulb's contents into a measuring container which your nurse will provide. 3. Record the time emptied and amount of drainage. Empty the drain(s) as often as your     doctor or nurse recommends.  Date                  Time                    Amount (Drain 1)                 Amount (Drain 2)  _____________________________________________________________________  _____________________________________________________________________  _____________________________________________________________________  _____________________________________________________________________  _____________________________________________________________________  _____________________________________________________________________  _____________________________________________________________________  _____________________________________________________________________  Squeezing the Jackson-Pratt Bulb- To squeeze the bulb: 1. Make sure the plug at the top of the bulb is open. 2. Squeeze the bulb tightly in your fist. You will hear air squeezing from the bulb. 3. Replace the plug while the bulb is squeezed. 4. Use a safety pin to attach the bulb to your clothing. This will keep the catheter from     pulling at the bulb insertion site.  When to call your doctor- Call your doctor if:  Drain site becomes red, swollen  or hot.  You have a fever greater than 101 degrees F.  There is oozing at the drain site.  Drain falls out (apply a guaze bandage over the drain hole and secure it with tape).  Drainage increases daily not related to  activity patterns. (You will usually have more drainage when you are active than when you are resting.)  Drainage has a bad odor.

## 2016-04-20 NOTE — Anesthesia Postprocedure Evaluation (Addendum)
Anesthesia Post Note  Patient: Mandie J Memoli  Procedure(s) Performed: Procedure(s) (LRB): IRRIGATION AND DEBRIDEMENT OF LEFT BREAST (Left)  Patient location during evaluation: PACU Anesthesia Type: General Level of consciousness: awake and alert Pain management: pain level controlled Vital Signs Assessment: post-procedure vital signs reviewed and stable Respiratory status: spontaneous breathing, nonlabored ventilation, respiratory function stable and patient connected to nasal cannula oxygen Cardiovascular status: blood pressure returned to baseline and stable Postop Assessment: no signs of nausea or vomiting Anesthetic complications: no       Last Vitals:  Vitals:   04/20/16 1410 04/20/16 1430  BP:  (!) 127/47  Pulse: 86 88  Resp: 17 16  Temp:  36.4 C    Last Pain:  Vitals:   04/20/16 1500  TempSrc:   PainSc: 4                  Fate Galanti,JAMES TERRILL

## 2016-04-20 NOTE — Op Note (Signed)
Operative Note   DATE OF OPERATION: 3.19.18  LOCATION: [facility]-outpatient  SURGICAL DIVISION: Plastic Surgery  PREOPERATIVE DIAGNOSES:  1. Necrosis left nipple areola complex 2. History left breast cancer 3. History therapeutic radiation left chest   POSTOPERATIVE DIAGNOSES:  same  PROCEDURE:  1. Debridement left breast skin, subcutaneous issue 20 cm2 2. Layered closure left breast 10 cm  SURGEON: Irene Limbo MD MBA  ASSISTANT: none  ANESTHESIA:  General.   EBL: 15 ml  COMPLICATIONS: None immediate.   INDICATIONS FOR PROCEDURE:  The patient, Leslie Love, is a 71 y.o. female born on 01/14/1946, is here for debridement left breast following bilateral removal ruptured implants with capsulectomies, mastopexies.    FINDINGS: Full thickness necrosis left nipple areola complex with ischemic changes breast and fat tissue most distal edge of superomedial pedicle.   DESCRIPTION OF PROCEDURE:  The patient's operative site was marked with the patient in the preoperative area. The patient was taken to the operating room. SCDs were placed and IV antibiotics were given. The patient's operative site was prepped and draped in a sterile fashion. A time out was performed and all information was confirmed to be correct. Tangential debridement of left nipple areola complex completed until bleeding tissue encountered. As noted in findings the most distal soft tissue from pedicle, located over lower pole, was also noted to be ischemic with thrombosed vasculature. This was excised sharply with scissors. Total area debridement skin subcutaneous tissue and breast tissue 25 cm2. The wound was irrigated and 15 Fr JP placed in cavity. This was secured with 2-0 nylon. Skin closure completed with 3-0 monocryl in dermis followed by short running and interrupted 4-0 nylon for skin closure. Total length closure 10 cm. Adaptic, dry dressing applied.   The patient was allowed to wake from anesthesia,  extubated and taken to the recovery room in satisfactory condition.   SPECIMENS: left breast debridement  DRAINS: 15 Fr JP in left breast  Irene Limbo, MD [facility] Plastic & Reconstructive Surgery [phone], pin [phone]

## 2016-04-20 NOTE — Transfer of Care (Signed)
Immediate Anesthesia Transfer of Care Note  Patient: Leslie Love  Procedure(s) Performed: Procedure(s): IRRIGATION AND DEBRIDEMENT OF LEFT BREAST (Left)  Patient Location: PACU  Anesthesia Type:General  Level of Consciousness: sedated and patient cooperative  Airway & Oxygen Therapy: Patient Spontanous Breathing and Patient connected to face mask oxygen  Post-op Assessment: Report given to RN and Post -op Vital signs reviewed and stable  Post vital signs: Reviewed and stable  Last Vitals:  Vitals:   04/20/16 1136  BP: 129/60  Pulse: 88  Resp: 16  Temp: 36.9 C    Last Pain:  Vitals:   04/20/16 1136  TempSrc: Oral         Complications: No apparent anesthesia complications

## 2016-04-20 NOTE — Anesthesia Preprocedure Evaluation (Signed)
Anesthesia Evaluation  Patient identified by MRN, date of birth, ID band Patient awake    Reviewed: Allergy & Precautions, H&P , NPO status , Patient's Chart, lab work & pertinent test results  History of Anesthesia Complications (+) PONVNegative for: history of anesthetic complications  Airway Mallampati: III  TM Distance: <3 FB Neck ROM: limited    Dental  (+) Poor Dentition, Chipped   Pulmonary neg shortness of breath, former smoker,    Pulmonary exam normal breath sounds clear to auscultation       Cardiovascular Exercise Tolerance: Good hypertension, (-) angina(-) Past MI and (-) DOE Normal cardiovascular exam Rhythm:regular Rate:Normal     Neuro/Psych negative neurological ROS  negative psych ROS   GI/Hepatic Neg liver ROS, GERD  Medicated and Controlled,  Endo/Other  negative endocrine ROS  Renal/GU negative Renal ROS  negative genitourinary   Musculoskeletal   Abdominal   Peds  Hematology negative hematology ROS (+)   Anesthesia Other Findings Past Medical History: No date: Anemia 2013: Breast cancer ([place])     Comment: DCIS No date: Cancer ([place])     Comment: Left breast cancer 2014, lumpectomy No date: Chronic insomnia No date: GERD (gastroesophageal reflux disease) No date: History of gastritis No date: History of shingles No date: Hyperlipidemia No date: Hypertension No date: Nocturnal leg cramps No date: Osteopenia No date: Osteopenia No date: Palpitations No date: Vitamin B 12 deficiency No date: Vitamin D deficiency  Past Surgical History: No date: ABDOMINAL HYSTERECTOMY No date: APPENDECTOMY 1980: AUGMENTATION MAMMAPLASTY Bilateral 2013: BREAST BIOPSY Left     Comment: DCIS 2013: BREAST LUMPECTOMY Left No date: BREAST LUMPECTOMY W/ NEEDLE LOCALIZATION Left No date: BREAST SURGERY No date: COLONOSCOPY 12/31/2014: COLONOSCOPY N/A     Comment: Procedure: COLONOSCOPY;  Surgeon: Hulen Luster,               MD;  Location: [facility] ENDOSCOPY;  Service:               Gastroenterology;  Laterality: N/A; No date: ESOPHAGOGASTRODUODENOSCOPY No date: KNEE ARTHROSCOPY Left     Comment: partial medial and lateral menisceoctomy  BMI    Body Mass Index:  29.01 kg/m      Reproductive/Obstetrics negative OB ROS                             Anesthesia Physical Anesthesia Plan  ASA: II  Anesthesia Plan: General   Post-op Pain Management:    Induction: Intravenous  Airway Management Planned: LMA  Additional Equipment:   Intra-op Plan:   Post-operative Plan: Extubation in OR  Informed Consent: I have reviewed the patients History and Physical, chart, labs and discussed the procedure including the risks, benefits and alternatives for the proposed anesthesia with the patient or authorized representative who has indicated his/her understanding and acceptance.   Dental advisory given  Plan Discussed with: CRNA  Anesthesia Plan Comments:         Anesthesia Quick Evaluation

## 2016-04-21 ENCOUNTER — Encounter (HOSPITAL_BASED_OUTPATIENT_CLINIC_OR_DEPARTMENT_OTHER): Payer: Self-pay | Admitting: Plastic Surgery

## 2016-05-04 ENCOUNTER — Encounter: Payer: Self-pay | Admitting: Radiation Oncology

## 2016-05-04 ENCOUNTER — Ambulatory Visit
Admission: RE | Admit: 2016-05-04 | Discharge: 2016-05-04 | Disposition: A | Discharge status: Home / Self Care | Payer: Medicare Other | Source: Ambulatory Visit | Attending: Radiation Oncology | Admitting: Radiation Oncology

## 2016-05-04 VITALS — BP 132/69 | HR 71 | Temp 97.4°F | Wt 171.5 lb

## 2016-05-04 DIAGNOSIS — Z170 Estrogen receptor positive status [ER+]: Secondary | ICD-10-CM | POA: Insufficient documentation

## 2016-05-04 DIAGNOSIS — Z79810 Long term (current) use of selective estrogen receptor modulators (SERMs): Secondary | ICD-10-CM | POA: Insufficient documentation

## 2016-05-04 DIAGNOSIS — D0512 Intraductal carcinoma in situ of left breast: Secondary | ICD-10-CM | POA: Diagnosis present

## 2016-05-04 NOTE — Progress Notes (Signed)
Radiation Oncology Follow up Note  Name: Leslie Love   Date:   05/04/2016 MRN:  [id number] DOB: Aug 31, 1945    This 71 y.o. 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  REFERRING PROVIDER: Idelle Crouch, MD  HPI: Patient is a 71 year old female now out over 4 years having completed whole breast radiation to her left breast for ER/PR positive ductal carcinoma in situ.Marland Kitchen She was noted back in February to have ruptured silicone implants bilaterally underwent removal unfortunately she developed necrosis of her left nipple requiring further mammoplasty. Pathology showed fibrosis and inflammation no evidence of malignancy. She is still has sutures present and is healing well. She currently is on tamoxifen tolerating that well without side effect.  COMPLICATIONS OF TREATMENT: none  FOLLOW UP COMPLIANCE: keeps appointments   PHYSICAL EXAM:  BP 132/69   Pulse 71   Temp 97.4 F (36.3 C)   Wt 171 lb 8.3 oz (77.8 kg)   BMI 29.44 kg/m  Patient is status post bilateral breast implant placement with incisions healing. No dominant mass or nodularity is noted in either breast in 2 positions examined. No axillary or supraclavicular adenopathy is appreciated. Well-developed well-nourished patient in NAD. HEENT reveals PERLA, EOMI, discs not visualized.  Oral cavity is clear. No oral mucosal lesions are identified. Neck is clear without evidence of cervical or supraclavicular adenopathy. Lungs are clear to A&P. Cardiac examination is essentially unremarkable with regular rate and rhythm without murmur rub or thrill. Abdomen is benign with no organomegaly or masses noted. Motor sensory and DTR levels are equal and symmetric in the upper and lower extremities. Cranial nerves II through XII are grossly intact. Proprioception is intact. No peripheral adenopathy or edema is identified. No motor or sensory  levels are noted. Crude visual fields are within normal range.  RADIOLOGY RESULTS: Previous mammograms are reviewed.  PLAN: Present time she is healing from her breast reconstruction. She is now out over 4 years with no evidence of disease. I'm going to discontinue follow-up care at this time. Be happy to reevaluate her any time should further radiation opinion be indicated. She continues on tamoxifen therapy without side effect.  I would like to take this opportunity to thank you for allowing me to participate in the care of your patient.Armstead Peaks., MD

## 2016-07-03 NOTE — Addendum Note (Signed)
Addendum  created 07/03/16 1230 by Rica Koyanagi, MD   Sign clinical note

## 2016-07-20 ENCOUNTER — Inpatient Hospital Stay: Payer: Medicare Other | Admitting: Oncology

## 2016-08-07 NOTE — Progress Notes (Signed)
[place]  [phone] [phone]) [phone]  ID: [id number] BONES OB: [birth year]  MR#: [id number]  SEG#:[id number]  Patient Care Team: Idelle Crouch, MD as PCP - General (Internal Medicine) Irene Limbo, MD as Consulting Physician (Plastic Surgery)  CHIEF COMPLAINT: Left breast DCIS, Left pulmonary nodule.  INTERVAL HISTORY: Patient returns to clinic today for routine 6 month evaluation. She continues to tolerate tamoxifen well, but still complains of occasional hot flashes.  She denies any pain. She has no neurologic complaints.  She denies any recent fevers.  She has a good appetite and denies weight loss.  She has no chest pain, cough, hemoptysis, or shortness of breath.  She denies any nausea, vomiting, constipation, or diarrhea.  She has no urinary complaints.  Patient offers no further specific complaints today.  REVIEW OF SYSTEMS:   Review of Systems  Constitutional: Negative.  Negative for fever, malaise/fatigue and weight loss.  Respiratory: Negative.  Negative for cough, hemoptysis and shortness of breath.   Cardiovascular: Negative.  Negative for chest pain and leg swelling.  Gastrointestinal: Negative.  Negative for abdominal pain.  Genitourinary: Negative.   Musculoskeletal: Negative.   Skin: Negative.  Negative for rash.  Neurological: Positive for sensory change. Negative for weakness.  Psychiatric/Behavioral: Negative.  The patient is not nervous/anxious.     As per HPI. Otherwise, a complete review of systems is negative.  PAST MEDICAL HISTORY: Past Medical History:  Diagnosis Date  . Anemia   . Breast cancer ([place]) 2013   DCIS  . Cancer [facility])    Left breast cancer 2014, lumpectomy  . Chronic insomnia   . Complication of anesthesia   . GERD (gastroesophageal reflux disease)   . History of gastritis   . History of shingles   . Hyperlipidemia   . Hypertension   . Nocturnal leg cramps   . Osteopenia   . Osteopenia   .  Palpitations   . PONV (postoperative nausea and vomiting)   . Vitamin B 12 deficiency   . Vitamin D deficiency     PAST SURGICAL HISTORY: Left lumpectomy, partial hysterectomy, appendectomy.  FAMILY HISTORY: Blood clots, diabetes, heart disease, sister died > 73 years ago of leukemia, grandmother with breast cancer, mother with cervical cancer.     ADVANCED DIRECTIVES:    HEALTH MAINTENANCE: Social History  Substance Use Topics  . Smoking status: Former Research scientist (life sciences)  . Smokeless tobacco: Never Used  . Alcohol use Yes     Comment: social     Colonoscopy:  PAP:  Bone density:  Lipid panel:  Allergies  Allergen Reactions  . Codeine Nausea And Vomiting    Current Outpatient Prescriptions  Medication Sig Dispense Refill  . ALPRAZolam (XANAX) 0.25 MG tablet Take 0.25 mg by mouth every 8 (eight) hours.  5  . aspirin EC 81 MG tablet Take 81 mg by mouth daily.     . bisoprolol-hydrochlorothiazide (ZIAC) 5-6.25 MG per tablet Take 1 tablet by mouth daily.     . Calcium Carbonate-Vitamin D (CALCIUM + D PO) Take by mouth.    . clotrimazole-betamethasone (LOTRISONE) cream Apply 1 application topically 2 (two) times daily.    . Cyanocobalamin (RA VITAMIN B-12 TR) 1000 MCG TBCR Take 1 tablet by mouth daily.     Marland Kitchen HYDROcodone-acetaminophen (NORCO) 5-325 MG tablet Take 1 tablet by mouth every 6 (six) hours as needed for moderate pain. 40 tablet 0  . omeprazole (PRILOSEC) 20 MG capsule Take 20 mg by mouth daily.     Marland Kitchen  pravastatin (PRAVACHOL) 40 MG tablet Take 40 mg by mouth daily.     . promethazine (PHENERGAN) 12.5 MG tablet Take 1 tablet (12.5 mg total) by mouth every 6 (six) hours as needed for nausea or vomiting. 30 tablet 0  . tamoxifen (NOLVADEX) 20 MG tablet Take 20 mg by mouth daily.      No current facility-administered medications for this visit.     OBJECTIVE: Vitals:   08/11/16 1204  BP: 128/77  Pulse: 76  Resp: 20  Temp: 98.4 F (36.9 C)     Body mass index is 30.36  kg/m.    ECOG FS:0 - Asymptomatic  General: Well-developed, well-nourished, no acute distress. Eyes: anicteric sclera. Breasts: Bilateral breasts and axilla without lumps or masses. Lungs: Clear to auscultation bilaterally. Heart: Regular rate and rhythm. No rubs, murmurs, or gallops. Abdomen: Soft, nontender, nondistended. No organomegaly noted, normoactive bowel sounds. Musculoskeletal: No edema, cyanosis, or clubbing. Neuro: Alert, answering all questions appropriately. Cranial nerves grossly intact. Skin: No rashes or petechiae noted. Psych: Normal affect.  LAB RESULTS:  Lab Results  Component Value Date   NA 136 04/17/2016   K 3.9 04/17/2016   CL 99 (L) 04/17/2016   CO2 27 04/17/2016   GLUCOSE 131 (H) 04/17/2016   BUN 8 04/17/2016   CREATININE 0.83 04/17/2016   CALCIUM 8.9 04/17/2016   PROT 6.6 05/14/2012   ALBUMIN 3.7 05/14/2012   AST 26 05/14/2012   ALT 22 05/14/2012   ALKPHOS 72 05/14/2012   BILITOT 0.3 05/14/2012   GFRNONAA >60 04/17/2016   GFRAA >60 04/17/2016    Lab Results  Component Value Date   WBC 9.3 05/14/2012   NEUTROABS 3.3 02/19/2012   HGB 15.5 05/14/2012   HCT 45.5 05/14/2012   MCV 84 05/14/2012   PLT 248 05/14/2012     STUDIES: No results found.  ASSESSMENT: Left breast DCIS, left pulmonary nodule.  PLAN:    1. Left breast DCIS: No evidence of disease. Patient completed removal of her breast implants in March 2018. Surgical pathology did not reveal any evidence of disease. No further intervention is needed.  Continue tamoxifen completing 5 years of treatment in February 2019.  Patient's most recent mammogram on January 31, 2016 was reported as BI-RADS 2. Repeat in December 2018. Return to clinic in 6 months for routine evaluation. 2. Left pulmonary nodule: CT scan from January 17, 2016 reviewed independently with no concern for metastatic disease. No further imaging is necessary. 3. Family history: Patient is negative for BCRA 1 and  2.  Patient expressed understanding and was in agreement with this plan. She also understands that She can call clinic at any time with any questions, concerns, or complaints.   Lloyd Huger, MD   08/12/2016 12:43 PM

## 2016-08-11 ENCOUNTER — Inpatient Hospital Stay: Payer: Medicare Other | Attending: Oncology | Admitting: Oncology

## 2016-08-11 VITALS — BP 128/77 | HR 76 | Temp 98.4°F | Resp 20 | Wt 176.9 lb

## 2016-08-11 DIAGNOSIS — Z8619 Personal history of other infectious and parasitic diseases: Secondary | ICD-10-CM | POA: Diagnosis not present

## 2016-08-11 DIAGNOSIS — M8580 Other specified disorders of bone density and structure, unspecified site: Secondary | ICD-10-CM | POA: Insufficient documentation

## 2016-08-11 DIAGNOSIS — Z79810 Long term (current) use of selective estrogen receptor modulators (SERMs): Secondary | ICD-10-CM | POA: Insufficient documentation

## 2016-08-11 DIAGNOSIS — K219 Gastro-esophageal reflux disease without esophagitis: Secondary | ICD-10-CM | POA: Diagnosis not present

## 2016-08-11 DIAGNOSIS — E538 Deficiency of other specified B group vitamins: Secondary | ICD-10-CM | POA: Diagnosis not present

## 2016-08-11 DIAGNOSIS — I10 Essential (primary) hypertension: Secondary | ICD-10-CM | POA: Insufficient documentation

## 2016-08-11 DIAGNOSIS — R911 Solitary pulmonary nodule: Secondary | ICD-10-CM | POA: Insufficient documentation

## 2016-08-11 DIAGNOSIS — E559 Vitamin D deficiency, unspecified: Secondary | ICD-10-CM

## 2016-08-11 DIAGNOSIS — E785 Hyperlipidemia, unspecified: Secondary | ICD-10-CM

## 2016-08-11 DIAGNOSIS — R232 Flushing: Secondary | ICD-10-CM | POA: Insufficient documentation

## 2016-08-11 DIAGNOSIS — Z7982 Long term (current) use of aspirin: Secondary | ICD-10-CM | POA: Insufficient documentation

## 2016-08-11 DIAGNOSIS — Z170 Estrogen receptor positive status [ER+]: Secondary | ICD-10-CM | POA: Diagnosis not present

## 2016-08-11 DIAGNOSIS — Z79899 Other long term (current) drug therapy: Secondary | ICD-10-CM | POA: Diagnosis not present

## 2016-08-11 DIAGNOSIS — Z87891 Personal history of nicotine dependence: Secondary | ICD-10-CM | POA: Diagnosis not present

## 2016-08-11 DIAGNOSIS — D0512 Intraductal carcinoma in situ of left breast: Secondary | ICD-10-CM | POA: Diagnosis present

## 2016-08-11 NOTE — Progress Notes (Signed)
Patient denies any concerns today.  

## 2016-09-07 ENCOUNTER — Encounter: Payer: Self-pay | Admitting: Oncology

## 2017-02-01 ENCOUNTER — Other Ambulatory Visit: Payer: Self-pay | Admitting: Oncology

## 2017-02-01 ENCOUNTER — Ambulatory Visit
Admission: RE | Admit: 2017-02-01 | Discharge: 2017-02-01 | Disposition: A | Discharge status: Home / Self Care | Payer: Medicare Other | Source: Ambulatory Visit | Attending: Oncology | Admitting: Oncology

## 2017-02-01 DIAGNOSIS — D0512 Intraductal carcinoma in situ of left breast: Secondary | ICD-10-CM

## 2017-02-01 DIAGNOSIS — R921 Mammographic calcification found on diagnostic imaging of breast: Secondary | ICD-10-CM | POA: Diagnosis not present

## 2017-02-01 HISTORY — DX: Personal history of irradiation: Z92.3

## 2017-02-10 NOTE — Progress Notes (Signed)
[place]  Telephone:(336) [phone] Fax:(336) [phone]  ID: Leslie Love OB: [birth year]  MR#: [id number]  URK#:[id number]  Patient Care Team: Idelle Crouch, MD as PCP - General (Internal Medicine) Irene Limbo, MD as Consulting Physician (Plastic Surgery)  CHIEF COMPLAINT: Left breast DCIS, Left pulmonary nodule.  INTERVAL HISTORY: Patient returns to clinic today for routine 6 month evaluation. She continues to tolerate tamoxifen well  other than occasional hot flashes. She denies any pain. She has no neurologic complaints.  She denies any recent fevers.  She has a good appetite and denies weight loss.  She has no chest pain, cough, hemoptysis, or shortness of breath.  She denies any nausea, vomiting, constipation, or diarrhea.  She has no urinary complaints.  Patient offers no further specific complaints today.  REVIEW OF SYSTEMS:   Review of Systems  Constitutional: Negative.  Negative for fever, malaise/fatigue and weight loss.  Respiratory: Negative.  Negative for cough, hemoptysis and shortness of breath.   Cardiovascular: Negative.  Negative for chest pain and leg swelling.  Gastrointestinal: Negative.  Negative for abdominal pain.  Genitourinary: Negative.   Musculoskeletal: Negative.   Skin: Negative.  Negative for rash.  Neurological: Positive for sensory change. Negative for weakness.  Psychiatric/Behavioral: Negative.  The patient is not nervous/anxious.     As per HPI. Otherwise, a complete review of systems is negative.  PAST MEDICAL HISTORY: Past Medical History:  Diagnosis Date  . Anemia   . Breast cancer ([place]) 2013   DCIS  . Cancer [facility])    Left breast cancer 2014, lumpectomy  . Chronic insomnia   . Complication of anesthesia   . GERD (gastroesophageal reflux disease)   . History of gastritis   . History of shingles   . Hyperlipidemia   . Hypertension   . Nocturnal leg cramps   . Osteopenia   . Osteopenia   . Palpitations    . Personal history of radiation therapy   . PONV (postoperative nausea and vomiting)   . Vitamin B 12 deficiency   . Vitamin D deficiency     PAST SURGICAL HISTORY: Left lumpectomy, partial hysterectomy, appendectomy.  FAMILY HISTORY: Blood clots, diabetes, heart disease, sister died > 66 years ago of leukemia, grandmother with breast cancer, mother with cervical cancer.     ADVANCED DIRECTIVES:    HEALTH MAINTENANCE: Social History   Tobacco Use  . Smoking status: Former Research scientist (life sciences)  . Smokeless tobacco: Never Used  Substance Use Topics  . Alcohol use: Yes    Comment: social  . Drug use: No     Colonoscopy:  PAP:  Bone density:  Lipid panel:  Allergies  Allergen Reactions  . Codeine Nausea And Vomiting    Current Outpatient Medications  Medication Sig Dispense Refill  . ALPRAZolam (XANAX) 0.25 MG tablet Take 0.25 mg by mouth every 8 (eight) hours.  5  . aspirin EC 81 MG tablet Take 81 mg by mouth daily.     . bisoprolol-hydrochlorothiazide (ZIAC) 5-6.25 MG per tablet Take 1 tablet by mouth daily.     . Calcium Carbonate-Vitamin D (CALCIUM + D PO) Take by mouth.    . chlorhexidine (PERIDEX) 0.12 % solution chlorhexidine gluconate 0.12 % mouthwash    . clotrimazole-betamethasone (LOTRISONE) cream Apply 1 application topically 2 (two) times daily.    . Coenzyme Q10 (CO Q-10) 100 MG CAPS Take by mouth.    . Cyanocobalamin (RA VITAMIN B-12 TR) 1000 MCG TBCR Take 1 tablet by mouth daily.     Marland Kitchen  EPINEPHrine (EPIPEN 2-PAK) 0.3 mg/0.3 mL IJ SOAJ injection EpiPen 2-Pak 0.3 mg/0.3 mL injection, auto-injector    . naproxen sodium (ANAPROX) 550 MG tablet naproxen sodium 550 mg tablet    . omeprazole (PRILOSEC) 20 MG capsule Take 20 mg by mouth daily.     . pravastatin (PRAVACHOL) 40 MG tablet Take 40 mg by mouth daily.     . promethazine (PHENERGAN) 12.5 MG tablet Take 1 tablet (12.5 mg total) by mouth every 6 (six) hours as needed for nausea or vomiting. 30 tablet 0  . tamoxifen  (NOLVADEX) 20 MG tablet Take 20 mg by mouth daily.      No current facility-administered medications for this visit.     OBJECTIVE: Vitals:   02/11/17 1452  BP: 136/79  Pulse: 77  Temp: 98 F (36.7 C)     Body mass index is 28.67 kg/m.    ECOG FS:0 - Asymptomatic  General: Well-developed, well-nourished, no acute distress. Eyes: anicteric sclera. Breasts: Bilateral breasts and axilla without lumps or masses. Lungs: Clear to auscultation bilaterally. Heart: Regular rate and rhythm. No rubs, murmurs, or gallops. Abdomen: Soft, nontender, nondistended. No organomegaly noted, normoactive bowel sounds. Musculoskeletal: No edema, cyanosis, or clubbing. Neuro: Alert, answering all questions appropriately. Cranial nerves grossly intact. Skin: No rashes or petechiae noted. Psych: Normal affect.  LAB RESULTS:  Lab Results  Component Value Date   NA 136 04/17/2016   K 3.9 04/17/2016   CL 99 (L) 04/17/2016   CO2 27 04/17/2016   GLUCOSE 131 (H) 04/17/2016   BUN 8 04/17/2016   CREATININE 0.83 04/17/2016   CALCIUM 8.9 04/17/2016   PROT 6.6 05/14/2012   ALBUMIN 3.7 05/14/2012   AST 26 05/14/2012   ALT 22 05/14/2012   ALKPHOS 72 05/14/2012   BILITOT 0.3 05/14/2012   GFRNONAA >60 04/17/2016   GFRAA >60 04/17/2016    Lab Results  Component Value Date   WBC 9.3 05/14/2012   NEUTROABS 3.3 02/19/2012   HGB 15.5 05/14/2012   HCT 45.5 05/14/2012   MCV 84 05/14/2012   PLT 248 05/14/2012     STUDIES: Mm Diag Breast Tomo Bilateral  Result Date: 02/01/2017 CLINICAL DATA:  72 year old female status post left lumpectomy for DCIS in 2013. Patient had subsequent explantation of the bilateral breasts in 2017 due to implant rupture. She had prolonged difficulty with postoperative healing and had her left nipple removed at that time as well. EXAM: 2D DIGITAL DIAGNOSTIC BILATERAL MAMMOGRAM WITH CAD AND ADJUNCT TOMO COMPARISON:  Previous exam(s). ACR Breast Density Category c: The breast  tissue is heterogeneously dense, which may obscure small masses. FINDINGS: Coarse calcifications are scattered diffusely throughout the upper-outer quadrant of the left breast most suggestive of fat necrosis. No suspicious masses, calcifications or areas of distortion are identified in the remainder of the bilateral breasts. Mammographic images were processed with CAD. IMPRESSION: 1. Probably benign left breast calcifications likely representing fat necrosis given the patient's surgical history. Recommendation is for six-month follow-up. 2. No mammographic evidence of malignancy on the right. RECOMMENDATION: Diagnostic left breast mammogram in 6 months. I have discussed the findings and recommendations with the patient. Results were also provided in writing at the conclusion of the visit. If applicable, a reminder letter will be sent to the patient regarding the next appointment. BI-RADS CATEGORY  3: Probably benign. Electronically Signed   By: Kristopher Oppenheim M.D.   On: 02/01/2017 11:45    ASSESSMENT: Left breast DCIS, left pulmonary nodule.  PLAN:  1. Left breast DCIS: No evidence of disease. Patient completed removal of her breast implants in March 2018. Surgical pathology did not reveal any evidence of disease. No further intervention is needed.  Continue tamoxifen completing 5 years of treatment in February 2019.  Patient has been instructed to complete her most recent prescription refill and then discontinue treatment.  Her most recent mammogram on February 01, 2017 was reported as BI-RADS 3.  Will repeat mammogram in 6 months and follow-up 1-2 days later.  If her mammogram is reported as within normal limits, patient could possibly be discharged from clinic.  2. Left pulmonary nodule: CT scan from January 17, 2016 reviewed independently with no concern for metastatic disease. No further imaging is necessary. 3. Family history: Patient is negative for BCRA 1 and 2.  Approximately 20 minutes spent  in discussion of which greater than 50% was consultation.  Patient expressed understanding and was in agreement with this plan. She also understands that She can call clinic at any time with any questions, concerns, or complaints.   Lloyd Huger, MD   02/14/2017 11:19 AM

## 2017-02-11 ENCOUNTER — Inpatient Hospital Stay: Payer: Medicare Other | Attending: Oncology | Admitting: Oncology

## 2017-02-11 ENCOUNTER — Encounter: Payer: Self-pay | Admitting: Oncology

## 2017-02-11 ENCOUNTER — Other Ambulatory Visit: Payer: Self-pay

## 2017-02-11 VITALS — BP 136/79 | HR 77 | Temp 98.0°F | Wt 167.0 lb

## 2017-02-11 DIAGNOSIS — Z170 Estrogen receptor positive status [ER+]: Secondary | ICD-10-CM | POA: Diagnosis not present

## 2017-02-11 DIAGNOSIS — E785 Hyperlipidemia, unspecified: Secondary | ICD-10-CM

## 2017-02-11 DIAGNOSIS — Z87891 Personal history of nicotine dependence: Secondary | ICD-10-CM

## 2017-02-11 DIAGNOSIS — M8580 Other specified disorders of bone density and structure, unspecified site: Secondary | ICD-10-CM | POA: Diagnosis not present

## 2017-02-11 DIAGNOSIS — R911 Solitary pulmonary nodule: Secondary | ICD-10-CM | POA: Diagnosis not present

## 2017-02-11 DIAGNOSIS — Z7982 Long term (current) use of aspirin: Secondary | ICD-10-CM

## 2017-02-11 DIAGNOSIS — K219 Gastro-esophageal reflux disease without esophagitis: Secondary | ICD-10-CM

## 2017-02-11 DIAGNOSIS — Z8619 Personal history of other infectious and parasitic diseases: Secondary | ICD-10-CM

## 2017-02-11 DIAGNOSIS — I10 Essential (primary) hypertension: Secondary | ICD-10-CM | POA: Diagnosis not present

## 2017-02-11 DIAGNOSIS — E559 Vitamin D deficiency, unspecified: Secondary | ICD-10-CM

## 2017-02-11 DIAGNOSIS — E538 Deficiency of other specified B group vitamins: Secondary | ICD-10-CM

## 2017-02-11 DIAGNOSIS — Z79899 Other long term (current) drug therapy: Secondary | ICD-10-CM

## 2017-02-11 DIAGNOSIS — D0512 Intraductal carcinoma in situ of left breast: Secondary | ICD-10-CM | POA: Diagnosis not present

## 2017-02-11 DIAGNOSIS — Z79810 Long term (current) use of selective estrogen receptor modulators (SERMs): Secondary | ICD-10-CM

## 2017-02-11 DIAGNOSIS — Z90710 Acquired absence of both cervix and uterus: Secondary | ICD-10-CM | POA: Diagnosis not present

## 2017-02-11 DIAGNOSIS — R232 Flushing: Secondary | ICD-10-CM

## 2017-08-03 ENCOUNTER — Ambulatory Visit
Admission: RE | Admit: 2017-08-03 | Discharge: 2017-08-03 | Disposition: A | Discharge status: Home / Self Care | Payer: Medicare Other | Source: Ambulatory Visit | Attending: Oncology | Admitting: Oncology

## 2017-08-03 DIAGNOSIS — D0512 Intraductal carcinoma in situ of left breast: Secondary | ICD-10-CM

## 2017-08-08 NOTE — Progress Notes (Signed)
[place]  Telephone:(336) [phone] Fax:(336) [phone]  ID: JOSELYNE SPAKE OB: 10-22-1945  MR#: [id number]  SWH#:[id number]  Patient Care Team: Idelle Crouch, MD as PCP - General (Internal Medicine) Irene Limbo, MD as Consulting Physician (Plastic Surgery)  CHIEF COMPLAINT: Left breast DCIS, Left pulmonary nodule.  INTERVAL HISTORY: Patient returns to clinic today for routine six-month evaluation and discussion of her mammogram results.  She discontinued tamoxifen in February 2019.  Patient states she continues to have generalized body aches, but otherwise feels well. She has no neurologic complaints.  She denies any recent fevers or illnesses.  She has a good appetite and denies weight loss.  She has no chest pain, cough, hemoptysis, or shortness of breath.  She denies any nausea, vomiting, constipation, or diarrhea.  She has no urinary complaints.  Patient otherwise feels well and offers no further specific complaints today.  REVIEW OF SYSTEMS:   Review of Systems  Constitutional: Negative.  Negative for fever, malaise/fatigue and weight loss.  Respiratory: Negative.  Negative for cough, hemoptysis and shortness of breath.   Cardiovascular: Negative.  Negative for chest pain and leg swelling.  Gastrointestinal: Negative.  Negative for abdominal pain.  Genitourinary: Negative.  Negative for dysuria.  Musculoskeletal: Negative.  Negative for back pain.  Skin: Negative.  Negative for rash.  Neurological: Negative.  Negative for sensory change, focal weakness and weakness.  Psychiatric/Behavioral: Negative.  The patient is not nervous/anxious.     As per HPI. Otherwise, a complete review of systems is negative.  PAST MEDICAL HISTORY: Past Medical History:  Diagnosis Date  . Anemia   . Breast cancer ([place]) 2013   DCIS  . Cancer [facility])    Left breast cancer 2014, lumpectomy  . Chronic insomnia   . Complication of anesthesia   . GERD (gastroesophageal  reflux disease)   . History of gastritis   . History of shingles   . Hyperlipidemia   . Hypertension   . Nocturnal leg cramps   . Osteopenia   . Osteopenia   . Palpitations   . Personal history of radiation therapy   . PONV (postoperative nausea and vomiting)   . Vitamin B 12 deficiency   . Vitamin D deficiency     PAST SURGICAL HISTORY: Left lumpectomy, partial hysterectomy, appendectomy.  FAMILY HISTORY: Blood clots, diabetes, heart disease, sister died > 37 years ago of leukemia, grandmother with breast cancer, mother with cervical cancer.     ADVANCED DIRECTIVES:    HEALTH MAINTENANCE: Social History   Tobacco Use  . Smoking status: Former Research scientist (life sciences)  . Smokeless tobacco: Never Used  Substance Use Topics  . Alcohol use: Yes    Comment: social  . Drug use: No     Colonoscopy:  PAP:  Bone density:  Lipid panel:  Allergies  Allergen Reactions  . Codeine Nausea And Vomiting    Current Outpatient Medications  Medication Sig Dispense Refill  . aspirin EC 81 MG tablet Take 81 mg by mouth daily.     . bisoprolol-hydrochlorothiazide (ZIAC) 5-6.25 MG per tablet Take 1 tablet by mouth daily.     . Calcium Carbonate-Vitamin D (CALCIUM + D PO) Take by mouth.    . Cyanocobalamin (RA VITAMIN B-12 TR) 1000 MCG TBCR Take 1 tablet by mouth daily.     Marland Kitchen omeprazole (PRILOSEC) 20 MG capsule Take 20 mg by mouth 2 (two) times daily before a meal.     . pravastatin (PRAVACHOL) 40 MG tablet Take 40 mg  by mouth daily.     Marland Kitchen ALPRAZolam (XANAX) 0.25 MG tablet Take 0.25 mg by mouth every 8 (eight) hours.  5  . EPINEPHrine (EPIPEN 2-PAK) 0.3 mg/0.3 mL IJ SOAJ injection EpiPen 2-Pak 0.3 mg/0.3 mL injection, auto-injector    . promethazine (PHENERGAN) 12.5 MG tablet Take 1 tablet (12.5 mg total) by mouth every 6 (six) hours as needed for nausea or vomiting. (Patient not taking: Reported on 08/09/2017) 30 tablet 0   No current facility-administered medications for this visit.      OBJECTIVE: Vitals:   08/09/17 1121  BP: 111/69  Pulse: 80  Resp: 18  Temp: (!) 95.9 F (35.5 C)     Body mass index is 31.07 kg/m.    ECOG FS:0 - Asymptomatic  General: Well-developed, well-nourished, no acute distress. Eyes: Pink conjunctiva, anicteric sclera. Breast: Exam deferred today. Lungs: Clear to auscultation bilaterally. Heart: Regular rate and rhythm. No rubs, murmurs, or gallops. Abdomen: Soft, nontender, nondistended. No organomegaly noted, normoactive bowel sounds. Musculoskeletal: No edema, cyanosis, or clubbing. Neuro: Alert, answering all questions appropriately. Cranial nerves grossly intact. Skin: No rashes or petechiae noted. Psych: Normal affect.  LAB RESULTS:  Lab Results  Component Value Date   NA 136 04/17/2016   K 3.9 04/17/2016   CL 99 (L) 04/17/2016   CO2 27 04/17/2016   GLUCOSE 131 (H) 04/17/2016   BUN 8 04/17/2016   CREATININE 0.83 04/17/2016   CALCIUM 8.9 04/17/2016   PROT 6.6 05/14/2012   ALBUMIN 3.7 05/14/2012   AST 26 05/14/2012   ALT 22 05/14/2012   ALKPHOS 72 05/14/2012   BILITOT 0.3 05/14/2012   GFRNONAA >60 04/17/2016   GFRAA >60 04/17/2016    Lab Results  Component Value Date   WBC 9.3 05/14/2012   NEUTROABS 3.3 02/19/2012   HGB 15.5 05/14/2012   HCT 45.5 05/14/2012   MCV 84 05/14/2012   PLT 248 05/14/2012     STUDIES: Mm Diag Breast Tomo Uni Left  Result Date: 08/03/2017 CLINICAL DATA:  72 year old patient presents for six-month follow-up of probably benign calcifications in the left breast. She has a history of left breast cancer status post lumpectomy 2013. Subsequently, in 2017 explantation of ruptured bilateral implants was performed. Her left nipple was removed due to difficulty with postoperative healing. EXAM: DIGITAL DIAGNOSTIC UNILATERAL LEFT MAMMOGRAM WITH CAD AND TOMO COMPARISON:  February 01, 2017 and earlier priors ACR Breast Density Category b: There are scattered areas of fibroglandular density.  FINDINGS: Diffusely scattered coarse calcifications in the upper-outer quadrant of the left breast are without significant change and most suggestive of fat necrosis. Areas of lucency within the left breast are consistent with benign fat necrosis. No suspicious mass, nonsurgical distortion, or suspicious microcalcification is identified to suggest malignancy. Mammographic images were processed with CAD. IMPRESSION: Stable appearance of probably benign calcifications related to benign fat necrosis. RECOMMENDATION: Bilateral diagnostic mammogram is recommended in January 2020. I have discussed the findings and recommendations with the patient. Results were also provided in writing at the conclusion of the visit. If applicable, a reminder letter will be sent to the patient regarding the next appointment. BI-RADS CATEGORY  3: Probably benign. Electronically Signed   By: Curlene Dolphin M.D.   On: 08/03/2017 10:38    ASSESSMENT: Left breast DCIS, left pulmonary nodule.  PLAN:    1. Left breast DCIS: No evidence of disease. Patient completed removal of her breast implants in March 2018. Surgical pathology did not reveal any evidence of disease. No  further intervention is needed.  Patient completed 5 years of tamoxifen in February 2019.  Her most recent mammogram on August 03, 2017 again was reported as BI-RADS 3 recommendation is to repeat in January 2020.  Now the patient has completed her tamoxifen and has a stable mammogram, she can be discharged from clinic.  All subsequent mammograms can now be ordered by primary care physician.   2. Left pulmonary nodule: CT scan from January 17, 2016 reviewed independently with no concern for metastatic disease. No further imaging is necessary. 3. Family history: Patient is negative for BCRA 1 and 2.  I spent a total of 20 minutes face-to-face with the patient of which greater than 50% of the visit was spent in counseling and coordination of care as detailed  above.   Patient expressed understanding and was in agreement with this plan. She also understands that She can call clinic at any time with any questions, concerns, or complaints.   Lloyd Huger, MD   08/09/2017 1:53 PM

## 2017-08-09 ENCOUNTER — Inpatient Hospital Stay: Payer: Medicare Other | Attending: Oncology | Admitting: Oncology

## 2017-08-09 ENCOUNTER — Other Ambulatory Visit: Payer: Self-pay

## 2017-08-09 VITALS — BP 111/69 | HR 80 | Temp 95.9°F | Resp 18 | Wt 181.0 lb

## 2017-08-09 DIAGNOSIS — Z86000 Personal history of in-situ neoplasm of breast: Secondary | ICD-10-CM | POA: Diagnosis not present

## 2017-08-09 DIAGNOSIS — Z9223 Personal history of estrogen therapy: Secondary | ICD-10-CM | POA: Insufficient documentation

## 2017-08-09 DIAGNOSIS — Z803 Family history of malignant neoplasm of breast: Secondary | ICD-10-CM | POA: Insufficient documentation

## 2017-08-09 DIAGNOSIS — M791 Myalgia, unspecified site: Secondary | ICD-10-CM | POA: Insufficient documentation

## 2017-08-09 DIAGNOSIS — E785 Hyperlipidemia, unspecified: Secondary | ICD-10-CM | POA: Insufficient documentation

## 2017-08-09 DIAGNOSIS — Z90711 Acquired absence of uterus with remaining cervical stump: Secondary | ICD-10-CM | POA: Diagnosis not present

## 2017-08-09 DIAGNOSIS — I10 Essential (primary) hypertension: Secondary | ICD-10-CM | POA: Diagnosis not present

## 2017-08-09 DIAGNOSIS — Z87891 Personal history of nicotine dependence: Secondary | ICD-10-CM | POA: Insufficient documentation

## 2017-08-09 DIAGNOSIS — K219 Gastro-esophageal reflux disease without esophagitis: Secondary | ICD-10-CM | POA: Diagnosis not present

## 2017-08-09 DIAGNOSIS — R911 Solitary pulmonary nodule: Secondary | ICD-10-CM | POA: Diagnosis not present

## 2017-08-09 DIAGNOSIS — Z7982 Long term (current) use of aspirin: Secondary | ICD-10-CM | POA: Diagnosis not present

## 2017-08-09 DIAGNOSIS — Z806 Family history of leukemia: Secondary | ICD-10-CM | POA: Diagnosis not present

## 2017-08-09 DIAGNOSIS — M8580 Other specified disorders of bone density and structure, unspecified site: Secondary | ICD-10-CM | POA: Diagnosis not present

## 2017-08-09 DIAGNOSIS — E559 Vitamin D deficiency, unspecified: Secondary | ICD-10-CM | POA: Insufficient documentation

## 2017-08-09 DIAGNOSIS — Z79899 Other long term (current) drug therapy: Secondary | ICD-10-CM | POA: Insufficient documentation

## 2017-08-09 DIAGNOSIS — D0512 Intraductal carcinoma in situ of left breast: Secondary | ICD-10-CM

## 2017-08-09 NOTE — Progress Notes (Signed)
Here for follow up. Doing well overall she stated.

## 2018-02-10 ENCOUNTER — Other Ambulatory Visit: Payer: Self-pay | Admitting: *Deleted

## 2018-02-10 DIAGNOSIS — D0512 Intraductal carcinoma in situ of left breast: Secondary | ICD-10-CM

## 2018-02-17 ENCOUNTER — Ambulatory Visit
Admission: RE | Admit: 2018-02-17 | Discharge: 2018-02-17 | Disposition: A | Discharge status: Home / Self Care | Payer: Medicare Other | Source: Ambulatory Visit | Attending: Oncology | Admitting: Oncology

## 2018-02-17 DIAGNOSIS — D0512 Intraductal carcinoma in situ of left breast: Secondary | ICD-10-CM | POA: Diagnosis not present

## 2018-02-22 ENCOUNTER — Telehealth: Payer: Self-pay | Admitting: *Deleted

## 2018-02-22 NOTE — Telephone Encounter (Signed)
Patient called asking for return call to discuss her mammogram results:  CLINICAL DATA:  Patient presents for a bilateral diagnostic examination to follow-up left breast microcalcifications. Previous malignant left lumpectomy for DCIS in the form of a retroareolar mass 2013. Implant removal 2018 due to rupture with subsequent left nipple removal due to complications with postoperative healing. Patient is due for her annual bilateral mammogram.  EXAM: DIGITAL DIAGNOSTIC BILATERAL MAMMOGRAM WITH CAD AND TOMO  COMPARISON:  Previous exam(s).  ACR Breast Density Category c: The breast tissue is heterogeneously dense, which may obscure small masses.  FINDINGS: Exam demonstrates moderate postsurgical changes over the central left breast and retroareolar region associated with the recent implant removal. There is evidence of fat necrosis over the surgical site with scattered and grouped microcalcifications. Most of the calcifications are stable, although there is a new group of microcalcifications over the anterior third of the inner upper left breast likely additional early dystrophic calcifications. Remainder of the left breast as well as the right breast is unchanged.  Mammographic images were processed with CAD.  IMPRESSION: Moderate postsurgical change of the central and retroareolar region of the left breast with probable benign microcalcifications as described likely early dystrophic calcifications.  RECOMMENDATION: Recommend an additional six-month follow-up diagnostic left breast mammogram with magnification views to document stability of these probable benign microcalcifications.  I have discussed the findings and recommendations with the patient. Results were also provided in writing at the conclusion of the visit. If applicable, a reminder letter will be sent to the patient regarding the next appointment.  BI-RADS CATEGORY  3: Probably  benign.   Electronically Signed   By: Marin Olp M.D.   On: 02/17/2018 16:46

## 2018-02-24 ENCOUNTER — Telehealth: Payer: Self-pay

## 2018-02-24 NOTE — Telephone Encounter (Signed)
Called Dr. Doy Hutching office and had to leave a message for the nurse to call me so I could let her know that Dr. Doy Hutching would need to follow up with patient's mammogram.

## 2018-02-24 NOTE — Telephone Encounter (Signed)
Called patient and told her mammogram results per Dr. Grayland Ormond that it was benign. I also told patient that she had been released by Dr. Grayland Ormond, so she would need to ask Dr. Doy Hutching to reorder her future mammograms. Patient understood and had no further questions. Told her that I would call Dr. Stacie Glaze office to let them know.

## 2018-02-24 NOTE — Telephone Encounter (Signed)
Dr. Doy Hutching nurse called back and I informed her what Dr. Grayland Ormond had stated. No further questions were asked.

## 2018-06-03 ENCOUNTER — Other Ambulatory Visit: Payer: Self-pay | Admitting: Internal Medicine

## 2018-06-03 DIAGNOSIS — E042 Nontoxic multinodular goiter: Secondary | ICD-10-CM

## 2018-07-11 ENCOUNTER — Other Ambulatory Visit: Payer: Self-pay | Admitting: Oncology

## 2018-07-11 DIAGNOSIS — R921 Mammographic calcification found on diagnostic imaging of breast: Secondary | ICD-10-CM

## 2018-09-09 ENCOUNTER — Other Ambulatory Visit: Payer: Self-pay

## 2018-09-09 ENCOUNTER — Ambulatory Visit
Admission: RE | Admit: 2018-09-09 | Discharge: 2018-09-09 | Disposition: A | Discharge status: Home / Self Care | Payer: Medicare Other | Source: Ambulatory Visit | Attending: Oncology | Admitting: Oncology

## 2018-09-09 DIAGNOSIS — R921 Mammographic calcification found on diagnostic imaging of breast: Secondary | ICD-10-CM | POA: Diagnosis not present

## 2018-09-12 ENCOUNTER — Other Ambulatory Visit: Payer: Self-pay | Admitting: Oncology

## 2018-09-12 DIAGNOSIS — R928 Other abnormal and inconclusive findings on diagnostic imaging of breast: Secondary | ICD-10-CM

## 2018-09-12 DIAGNOSIS — R921 Mammographic calcification found on diagnostic imaging of breast: Secondary | ICD-10-CM

## 2018-09-16 ENCOUNTER — Other Ambulatory Visit: Payer: Self-pay

## 2018-09-16 ENCOUNTER — Ambulatory Visit
Admission: RE | Admit: 2018-09-16 | Discharge: 2018-09-16 | Disposition: A | Discharge status: Home / Self Care | Payer: Medicare Other | Source: Ambulatory Visit | Attending: Oncology | Admitting: Oncology

## 2018-09-16 DIAGNOSIS — R928 Other abnormal and inconclusive findings on diagnostic imaging of breast: Secondary | ICD-10-CM | POA: Insufficient documentation

## 2018-09-16 DIAGNOSIS — R921 Mammographic calcification found on diagnostic imaging of breast: Secondary | ICD-10-CM | POA: Diagnosis present

## 2018-09-16 HISTORY — PX: BREAST BIOPSY: SHX20

## 2018-09-19 LAB — SURGICAL PATHOLOGY

## 2019-01-23 ENCOUNTER — Ambulatory Visit: Payer: Medicare Other | Attending: Internal Medicine

## 2019-01-23 DIAGNOSIS — Z20822 Contact with and (suspected) exposure to covid-19: Secondary | ICD-10-CM

## 2019-01-24 LAB — NOVEL CORONAVIRUS, NAA: SARS-CoV-2, NAA: NOT DETECTED

## 2019-03-31 ENCOUNTER — Ambulatory Visit: Payer: Medicare Other | Attending: Internal Medicine

## 2019-03-31 DIAGNOSIS — Z23 Encounter for immunization: Secondary | ICD-10-CM | POA: Insufficient documentation

## 2019-03-31 NOTE — Progress Notes (Signed)
   Covid-19 Vaccination Clinic  Name:  Leslie Love    MRN: [id number] DOB: [birth year]  03/31/2019  [name] was observed post Covid-19 immunization for 15 minutes without incidence. She was provided with Vaccine Information Sheet and instruction to access the V-Safe system.   [name] was instructed to call 911 with any severe reactions post vaccine: Marland Kitchen Difficulty breathing  . Swelling of your face and throat  . A fast heartbeat  . A bad rash all over your body  . Dizziness and weakness    Immunizations Administered    Name Date Dose VIS Date Route   Pfizer COVID-19 Vaccine 03/31/2019 11:48 AM 0.3 mL 01/13/2019 Intramuscular   Manufacturer: [place]   Lot: [id number]   [place]: [id number]

## 2019-04-02 ENCOUNTER — Ambulatory Visit: Payer: Medicare Other

## 2019-04-25 ENCOUNTER — Ambulatory Visit: Payer: Medicare Other | Attending: Internal Medicine

## 2019-04-25 DIAGNOSIS — Z23 Encounter for immunization: Secondary | ICD-10-CM

## 2019-04-25 NOTE — Progress Notes (Signed)
   Covid-19 Vaccination Clinic  Name:  ADIAH DRAGOTTA    MRN: [id number] DOB: [birth year]  04/25/2019  Ms. Riner was observed post Covid-19 immunization for 15 minutes without incident. She was provided with Vaccine Information Sheet and instruction to access the V-Safe system.   Ms. Hendee was instructed to call 911 with any severe reactions post vaccine: Marland Kitchen Difficulty breathing  . Swelling of face and throat  . A fast heartbeat  . A bad rash all over body  . Dizziness and weakness   Immunizations Administered    Name Date Dose VIS Date Route   Pfizer COVID-19 Vaccine 04/25/2019  2:17 PM 0.3 mL 01/13/2019 Intramuscular   Manufacturer: [place]   Lot: [id number]   [place]: [id number]

## 2019-11-13 ENCOUNTER — Other Ambulatory Visit: Payer: Self-pay | Admitting: Internal Medicine

## 2019-11-13 DIAGNOSIS — Z1231 Encounter for screening mammogram for malignant neoplasm of breast: Secondary | ICD-10-CM

## 2019-11-16 ENCOUNTER — Other Ambulatory Visit: Payer: Self-pay

## 2019-11-16 ENCOUNTER — Ambulatory Visit
Admission: RE | Admit: 2019-11-16 | Discharge: 2019-11-16 | Disposition: A | Discharge status: Home / Self Care | Payer: Medicare Other | Source: Ambulatory Visit | Attending: Internal Medicine | Admitting: Internal Medicine

## 2019-11-16 DIAGNOSIS — Z1231 Encounter for screening mammogram for malignant neoplasm of breast: Secondary | ICD-10-CM | POA: Insufficient documentation

## 2020-04-26 ENCOUNTER — Encounter: Payer: Self-pay | Admitting: Emergency Medicine

## 2020-04-26 ENCOUNTER — Emergency Department: Payer: Medicare Other

## 2020-04-26 ENCOUNTER — Emergency Department
Admission: EM | Admit: 2020-04-26 | Discharge: 2020-04-26 | Disposition: A | Discharge status: Home / Self Care | Payer: Medicare Other | Attending: Emergency Medicine | Admitting: Emergency Medicine

## 2020-04-26 ENCOUNTER — Other Ambulatory Visit: Payer: Self-pay

## 2020-04-26 DIAGNOSIS — R1110 Vomiting, unspecified: Secondary | ICD-10-CM | POA: Diagnosis not present

## 2020-04-26 DIAGNOSIS — Z79899 Other long term (current) drug therapy: Secondary | ICD-10-CM | POA: Insufficient documentation

## 2020-04-26 DIAGNOSIS — Z853 Personal history of malignant neoplasm of breast: Secondary | ICD-10-CM | POA: Insufficient documentation

## 2020-04-26 DIAGNOSIS — R1013 Epigastric pain: Secondary | ICD-10-CM | POA: Diagnosis present

## 2020-04-26 DIAGNOSIS — Z90710 Acquired absence of both cervix and uterus: Secondary | ICD-10-CM | POA: Diagnosis not present

## 2020-04-26 DIAGNOSIS — Z9049 Acquired absence of other specified parts of digestive tract: Secondary | ICD-10-CM | POA: Insufficient documentation

## 2020-04-26 DIAGNOSIS — I10 Essential (primary) hypertension: Secondary | ICD-10-CM | POA: Insufficient documentation

## 2020-04-26 DIAGNOSIS — Z87891 Personal history of nicotine dependence: Secondary | ICD-10-CM | POA: Diagnosis not present

## 2020-04-26 LAB — URINALYSIS, COMPLETE (UACMP) WITH MICROSCOPIC
Bacteria, UA: NONE SEEN
Bilirubin Urine: NEGATIVE
Glucose, UA: NEGATIVE mg/dL
Hgb urine dipstick: NEGATIVE
Ketones, ur: 20 mg/dL — AB
Nitrite: NEGATIVE
Protein, ur: NEGATIVE mg/dL
Specific Gravity, Urine: 1.016 (ref 1.005–1.030)
pH: 6 (ref 5.0–8.0)

## 2020-04-26 LAB — CBC WITH DIFFERENTIAL/PLATELET
Abs Immature Granulocytes: 0.02 10*3/uL (ref 0.00–0.07)
Basophils Absolute: 0 10*3/uL (ref 0.0–0.1)
Basophils Relative: 1 %
Eosinophils Absolute: 0 10*3/uL (ref 0.0–0.5)
Eosinophils Relative: 1 %
HCT: 41.4 % (ref 36.0–46.0)
Hemoglobin: 13.7 g/dL (ref 12.0–15.0)
Immature Granulocytes: 0 %
Lymphocytes Relative: 5 %
Lymphs Abs: 0.3 10*3/uL — ABNORMAL LOW (ref 0.7–4.0)
MCH: 28.1 pg (ref 26.0–34.0)
MCHC: 33.1 g/dL (ref 30.0–36.0)
MCV: 85 fL (ref 80.0–100.0)
Monocytes Absolute: 0.3 10*3/uL (ref 0.1–1.0)
Monocytes Relative: 4 %
Neutro Abs: 5.5 10*3/uL (ref 1.7–7.7)
Neutrophils Relative %: 89 %
Platelets: 196 10*3/uL (ref 150–400)
RBC: 4.87 MIL/uL (ref 3.87–5.11)
RDW: 13.6 % (ref 11.5–15.5)
WBC: 6.2 10*3/uL (ref 4.0–10.5)
nRBC: 0 % (ref 0.0–0.2)

## 2020-04-26 LAB — COMPREHENSIVE METABOLIC PANEL
ALT: 19 U/L (ref 0–44)
AST: 24 U/L (ref 15–41)
Albumin: 4.2 g/dL (ref 3.5–5.0)
Alkaline Phosphatase: 52 U/L (ref 38–126)
Anion gap: 10 (ref 5–15)
BUN: 10 mg/dL (ref 8–23)
CO2: 25 mmol/L (ref 22–32)
Calcium: 8.5 mg/dL — ABNORMAL LOW (ref 8.9–10.3)
Chloride: 100 mmol/L (ref 98–111)
Creatinine, Ser: 0.68 mg/dL (ref 0.44–1.00)
GFR, Estimated: >60 mL/min (ref >60)
Glucose, Bld: 104 mg/dL — ABNORMAL HIGH (ref 70–99)
Potassium: 3.1 mmol/L — ABNORMAL LOW (ref 3.5–5.1)
Sodium: 135 mmol/L (ref 135–145)
Total Bilirubin: 0.9 mg/dL (ref 0.3–1.2)
Total Protein: 6.8 g/dL (ref 6.5–8.1)

## 2020-04-26 LAB — LIPASE, BLOOD: Lipase: 26 U/L (ref 11–51)

## 2020-04-26 MED ORDER — LIDOCAINE VISCOUS HCL 2 % MT SOLN
15.0000 mL | Freq: Once | OROMUCOSAL | Status: AC
  Administered 2020-04-26: 15 mL via ORAL
  Filled 2020-04-26: qty 15

## 2020-04-26 MED ORDER — SUCRALFATE 1 G PO TABS: 1.0000 g | ORAL_TABLET | Freq: Four times a day (QID) | ORAL | 0 refills | Status: DC

## 2020-04-26 MED ORDER — ALUM & MAG HYDROXIDE-SIMETH 200-200-20 MG/5ML PO SUSP
30.0000 mL | Freq: Once | ORAL | Status: AC
  Administered 2020-04-26: 30 mL via ORAL
  Filled 2020-04-26: qty 30

## 2020-04-26 NOTE — Discharge Instructions (Addendum)
Please seek medical attention for any high fevers, chest pain, shortness of breath, change in behavior, persistent vomiting, bloody stool or any other new or concerning symptoms.  

## 2020-04-26 NOTE — ED Triage Notes (Signed)
First Nurse Note:  Arrives from [facility] for ED evaluation for abdominal pain, vomiting, RUQ pain.  Per report, pain woke patient up.    Patient is AAOx3.  Skin warm and dry. NAD

## 2020-04-26 NOTE — ED Provider Notes (Signed)
[facility] Emergency Department Provider Note   ____________________________________________   I have reviewed the triage vital signs and the nursing notes.   HISTORY  Chief Complaint Abdominal Pain, Emesis, and Diarrhea   History limited by: Not Limited   HPI Leslie Love is a 75 y.o. female who presents to the emergency department today because of concern for abdominal pain. The pain is located in the epigastric region. It woke the patient up from sleep. She did have an episode of vomiting which consisted of poorly digested food from last night. She did have a feeling of swelling to the epigastric region. The pain radiated to bilateral upper abdomen. She denies any fevers.    Records reviewed. Per medical record review patient has a history of appendectomy, hysterectomy.   Past Medical History:  Diagnosis Date  . Anemia   . 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 Cancer [facility])    Left breast cancer 2014, lumpectomy  . Chronic insomnia   . Complication of anesthesia   . GERD (gastroesophageal reflux disease)   . History of gastritis   . History of shingles   . Hyperlipidemia   . Hypertension   . Nocturnal leg cramps   . Osteopenia   . Osteopenia   . Palpitations   . Personal history of radiation therapy   . PONV (postoperative nausea and vomiting)   . Vitamin B 12 deficiency   . Vitamin D deficiency     Patient Active Problem List   Diagnosis Date Noted  . History of breast cancer 04/10/2016  . History of augmentation mammoplasty 02/20/2016  . History of therapeutic radiation 02/20/2016  . 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 Pulmonary nodule, left 01/19/2016  . B12 deficiency 10/18/2015  . Anemia, unspecified 07/12/2013  . Chronic insomnia 07/12/2013  . HTN (hypertension) 07/12/2013  . Hyperlipidemia, unspecified 07/12/2013  . Breast cancer ([place]) 07/12/2013  . Palpitations 07/12/2013  . Osteopenia 07/12/2013   . Vitamin D deficiency, unspecified 07/12/2013    Past Surgical History:  Procedure Laterality Date  . ABDOMINAL HYSTERECTOMY    . APPENDECTOMY    . AUGMENTATION MAMMAPLASTY Bilateral 1980   removed due to bilateral implant rupture in 02/2016 left nipple removed  . BREAST BIOPSY Left 2013   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  . BREAST BIOPSY Left 09/16/2018   stero calcs x clip benign   . BREAST BIOPSY Left 09/16/2018   stereo calcs ribbon clip benign   . BREAST IMPLANT REMOVAL Bilateral 04/10/2016   Procedure: REMOVAL BILATERAL  BREAST IMPLANTS AND IMPLANT MATERIAL;  Surgeon: Irene Limbo, MD;  Location: [place];  Service: Plastics;  Laterality: Bilateral;  . BREAST LUMPECTOMY Left 2013   clear margins  . BREAST LUMPECTOMY W/ NEEDLE LOCALIZATION Left   . BREAST SURGERY    . CAPSULECTOMY Bilateral 04/10/2016   Procedure: CAPSULECTOMY;  Surgeon: Irene Limbo, MD;  Location: [place];  Service: Plastics;  Laterality: Bilateral;  . COLONOSCOPY    . COLONOSCOPY N/A 12/31/2014   Procedure: COLONOSCOPY;  Surgeon: Hulen Luster, MD;  Location: [facility] ENDOSCOPY;  Service: Gastroenterology;  Laterality: N/A;  . COLONOSCOPY WITH PROPOFOL N/A 02/04/2016   Procedure: COLONOSCOPY WITH PROPOFOL;  Surgeon: Lollie Sails, MD;  Location: [facility] ENDOSCOPY;  Service: Endoscopy;  Laterality: N/A;  . ESOPHAGOGASTRODUODENOSCOPY    . INCISION AND DRAINAGE OF WOUND Left 04/20/2016   Procedure: IRRIGATION AND DEBRIDEMENT OF LEFT BREAST;  Surgeon:  Irene Limbo, MD;  Location: [place];  Service: Plastics;  Laterality: Left;  . KNEE ARTHROSCOPY Left    partial medial and lateral menisceoctomy  . MASTOPEXY Bilateral 04/10/2016   Procedure: MASTOPEXY;  Surgeon: Irene Limbo, MD;  Location: [place];  Service: Plastics;  Laterality: Bilateral;    Prior to Admission medications   Medication Sig Start Date End Date Taking? Authorizing Provider   ALPRAZolam (XANAX) 0.25 MG tablet Take 0.25 mg by mouth every 8 (eight) hours. 07/16/14   [provider]  aspirin EC 81 MG tablet Take 81 mg by mouth daily.     [provider]  bisoprolol-hydrochlorothiazide (ZIAC) 5-6.25 MG per tablet Take 1 tablet by mouth daily.  08/07/14   [provider]  Calcium Carbonate-Vitamin D (CALCIUM + D PO) Take by mouth.    [provider]  Cyanocobalamin (RA VITAMIN B-12 TR) 1000 MCG TBCR Take 1 tablet by mouth daily.     [provider]  EPINEPHrine (EPIPEN 2-PAK) 0.3 mg/0.3 mL IJ SOAJ injection EpiPen 2-Pak 0.3 mg/0.3 mL injection, auto-injector    [provider]  omeprazole (PRILOSEC) 20 MG capsule Take 20 mg by mouth 2 (two) times daily before a meal.  08/03/14   [provider]  pravastatin (PRAVACHOL) 40 MG tablet Take 40 mg by mouth daily.  08/07/14   [provider]  promethazine (PHENERGAN) 12.5 MG tablet Take 1 tablet (12.5 mg total) by mouth every 6 (six) hours as needed for nausea or vomiting. Patient not taking: Reported on 08/09/2017 04/10/16   Irene Limbo, MD    Allergies Codeine  Family History  Problem Relation Age of Onset  . Breast cancer Maternal Aunt 60  . Breast cancer Paternal Grandmother 25  . Breast cancer Maternal Aunt 60  . Breast cancer Daughter 39    Social History Social History   Tobacco Use  . Smoking status: Former Research scientist (life sciences)  . Smokeless tobacco: Never Used  Vaping Use  . Vaping Use: Former  Substance Use Topics  . Alcohol use: Yes    Comment: social  . Drug use: No    Review of Systems Constitutional: No fever/chills Eyes: No visual changes. ENT: No sore throat. Cardiovascular: Denies chest pain. Respiratory: Denies shortness of breath. Gastrointestinal: Positive for abdominal pain, nausea and vomiting.  Genitourinary: Negative for dysuria. Musculoskeletal: Negative for back pain. Skin: Negative for rash. Neurological: Negative for  headaches, focal weakness or numbness.  ____________________________________________   PHYSICAL EXAM:  VITAL SIGNS: ED Triage Vitals  Enc Vitals Group     BP 04/26/20 1717 (!) 121/57     Pulse Rate 04/26/20 1717 (!) 105     Resp 04/26/20 1717 18     Temp 04/26/20 1717 99.4 F (37.4 C)     Temp Source 04/26/20 1717 Oral     SpO2 04/26/20 1717 96 %     Weight 04/26/20 1718 155 lb (70.3 kg)     Height 04/26/20 1718 5\' 4"  (1.626 m)     Head Circumference --      Peak Flow --      Pain Score 04/26/20 1718 8   Constitutional: Alert and oriented.  Eyes: Conjunctivae are normal.  ENT      Head: Normocephalic and atraumatic.      Nose: No congestion/rhinnorhea.      Mouth/Throat: Mucous membranes are moist.      Neck: No stridor. Hematological/Lymphatic/Immunilogical: No cervical lymphadenopathy. Cardiovascular: Normal rate, regular rhythm.  No murmurs, rubs, or gallops.  Respiratory: Normal respiratory effort without tachypnea nor retractions. Breath sounds are clear and equal bilaterally. No wheezes/rales/rhonchi. Gastrointestinal: Soft and minimally tender in the epigastric region. Genitourinary: Deferred Musculoskeletal: Normal range of motion in all extremities. No lower extremity edema. Neurologic:  Normal speech and language. No gross focal neurologic deficits are appreciated.  Skin:  Skin is warm, dry and intact. No rash noted. Psychiatric: Mood and affect are normal. Speech and behavior are normal. Patient exhibits appropriate insight and judgment.  ____________________________________________    LABS (pertinent positives/negatives)  Lipase 26 CMP wnl except k 3.1, glu 104, ca 8.5 CBC wbc 6.2, hgb 13.7, plt 196 ____________________________________________   EKG  None  ____________________________________________    RADIOLOGY  RUQ [place] No gallstones or evidence of cholecystitis. Fatty  liver ____________________________________________   PROCEDURES  Procedures  ____________________________________________   INITIAL IMPRESSION / ASSESSMENT AND PLAN / ED COURSE  Pertinent labs & imaging results that were available during my care of the patient were reviewed by me and considered in my medical decision making (see chart for details).   Patient presented to the emergency department today because of concerns for epigastric pain.  On exam patient was afebrile.  Blood work without any concerning leukocytosis elevated lipase or elevated hepatic function panel.  I did obtain an ultrasound given location of the pain this did not show any concerning signs for cholecystitis or gallstones.  Patient did feel better after GI cocktail.  At this time I think gastritis likely.  Doubt aortic or cardiac etiology.  Patient is on omeprazole.  Will add on sucralfate.  ____________________________________________   FINAL CLINICAL IMPRESSION(S) / ED DIAGNOSES  Final diagnoses:  Epigastric pain     Note: This dictation was prepared with Dragon dictation. Any transcriptional errors that result from this process are unintentional     Nance Pear, MD 04/26/20 2116

## 2020-10-21 ENCOUNTER — Other Ambulatory Visit: Payer: Self-pay | Admitting: Internal Medicine

## 2020-10-21 DIAGNOSIS — Z1231 Encounter for screening mammogram for malignant neoplasm of breast: Secondary | ICD-10-CM

## 2020-11-18 ENCOUNTER — Other Ambulatory Visit: Payer: Self-pay

## 2020-11-18 ENCOUNTER — Ambulatory Visit
Admission: RE | Admit: 2020-11-18 | Discharge: 2020-11-18 | Disposition: A | Discharge status: Home / Self Care | Payer: Medicare Other | Source: Ambulatory Visit | Attending: Internal Medicine | Admitting: Internal Medicine

## 2020-11-18 DIAGNOSIS — Z1231 Encounter for screening mammogram for malignant neoplasm of breast: Secondary | ICD-10-CM | POA: Insufficient documentation

## 2021-01-07 ENCOUNTER — Other Ambulatory Visit: Payer: Self-pay | Admitting: Nurse Practitioner

## 2021-01-07 DIAGNOSIS — R1310 Dysphagia, unspecified: Secondary | ICD-10-CM

## 2021-01-14 ENCOUNTER — Other Ambulatory Visit: Payer: Self-pay

## 2021-01-14 ENCOUNTER — Ambulatory Visit
Admission: RE | Admit: 2021-01-14 | Discharge: 2021-01-14 | Disposition: A | Discharge status: Home / Self Care | Payer: Medicare Other | Source: Ambulatory Visit | Attending: Nurse Practitioner | Admitting: Nurse Practitioner

## 2021-01-14 DIAGNOSIS — R1310 Dysphagia, unspecified: Secondary | ICD-10-CM | POA: Diagnosis not present

## 2021-01-14 NOTE — Progress Notes (Signed)
Believe I received in error. Appears she is your PCP patient. Thanks.

## 2021-03-31 ENCOUNTER — Encounter
Admission: RE | Disposition: A | Discharge status: Home / Self Care | Payer: Self-pay | Source: Home / Self Care | Attending: Gastroenterology

## 2021-03-31 ENCOUNTER — Ambulatory Visit
Admission: RE | Admit: 2021-03-31 | Discharge: 2021-03-31 | Disposition: A | Discharge status: Home / Self Care | Payer: Medicare Other | Attending: Gastroenterology | Admitting: Gastroenterology

## 2021-03-31 ENCOUNTER — Ambulatory Visit: Payer: Medicare Other | Admitting: Certified Registered Nurse Anesthetist

## 2021-03-31 ENCOUNTER — Encounter: Payer: Self-pay | Admitting: *Deleted

## 2021-03-31 DIAGNOSIS — R1310 Dysphagia, unspecified: Secondary | ICD-10-CM | POA: Diagnosis not present

## 2021-03-31 DIAGNOSIS — Z923 Personal history of irradiation: Secondary | ICD-10-CM | POA: Insufficient documentation

## 2021-03-31 DIAGNOSIS — Z87891 Personal history of nicotine dependence: Secondary | ICD-10-CM | POA: Diagnosis not present

## 2021-03-31 DIAGNOSIS — K219 Gastro-esophageal reflux disease without esophagitis: Secondary | ICD-10-CM | POA: Diagnosis not present

## 2021-03-31 DIAGNOSIS — Z79899 Other long term (current) drug therapy: Secondary | ICD-10-CM | POA: Insufficient documentation

## 2021-03-31 DIAGNOSIS — I10 Essential (primary) hypertension: Secondary | ICD-10-CM | POA: Insufficient documentation

## 2021-03-31 DIAGNOSIS — E785 Hyperlipidemia, unspecified: Secondary | ICD-10-CM | POA: Insufficient documentation

## 2021-03-31 DIAGNOSIS — Z853 Personal history of malignant neoplasm of breast: Secondary | ICD-10-CM | POA: Diagnosis not present

## 2021-03-31 HISTORY — PX: ESOPHAGOGASTRODUODENOSCOPY (EGD) WITH PROPOFOL: SHX5813

## 2021-03-31 SURGERY — ESOPHAGOGASTRODUODENOSCOPY (EGD) WITH PROPOFOL
Anesthesia: General

## 2021-03-31 MED ORDER — PROPOFOL 500 MG/50ML IV EMUL
INTRAVENOUS | Status: AC
  Filled 2021-03-31: qty 50

## 2021-03-31 MED ORDER — PROPOFOL 10 MG/ML IV BOLUS
INTRAVENOUS | Status: DC | PRN
  Administered 2021-03-31: 70 mg via INTRAVENOUS

## 2021-03-31 MED ORDER — PROPOFOL 500 MG/50ML IV EMUL
INTRAVENOUS | Status: DC | PRN
Start: 2021-03-31 — End: 2021-03-31
  Administered 2021-03-31: 150 ug/kg/min via INTRAVENOUS

## 2021-03-31 MED ORDER — LIDOCAINE HCL (CARDIAC) PF 100 MG/5ML IV SOSY
PREFILLED_SYRINGE | INTRAVENOUS | Status: DC | PRN
  Administered 2021-03-31: 50 mg via INTRAVENOUS

## 2021-03-31 MED ORDER — SODIUM CHLORIDE 0.9 % IV SOLN
INTRAVENOUS | Status: DC
  Administered 2021-03-31: 20 mL/h via INTRAVENOUS

## 2021-03-31 NOTE — Anesthesia Procedure Notes (Signed)
Date/Time: 03/31/2021 9:37 AM Performed by: Johnna Acosta, CRNA Pre-anesthesia Checklist: Patient identified, Emergency Drugs available, Suction available, Patient being monitored and Timeout performed Patient Re-evaluated:Patient Re-evaluated prior to induction Oxygen Delivery Method: Nasal cannula Preoxygenation: Pre-oxygenation with 100% oxygen Induction Type: IV induction

## 2021-03-31 NOTE — Anesthesia Postprocedure Evaluation (Signed)
Anesthesia Post Note  Patient: Madason J Schrade  Procedure(s) Performed: ESOPHAGOGASTRODUODENOSCOPY (EGD) WITH PROPOFOL  Patient location during evaluation: Endoscopy Anesthesia Type: General Level of consciousness: awake and alert Pain management: pain level controlled Vital Signs Assessment: post-procedure vital signs reviewed and stable Respiratory status: spontaneous breathing, nonlabored ventilation, respiratory function stable and patient connected to nasal cannula oxygen Cardiovascular status: blood pressure returned to baseline and stable Postop Assessment: no apparent nausea or vomiting Anesthetic complications: no   No notable events documented.   Last Vitals:  Vitals:   03/31/21 1013 03/31/21 1023  BP: (!) 146/60 130/64  Pulse: 69 65  Resp: 12 13  Temp:    SpO2: 100% 100%    Last Pain:  Vitals:   03/31/21 1023  TempSrc:   PainSc: 0-No pain                 Precious Haws Zollie Ellery

## 2021-03-31 NOTE — Op Note (Signed)
[facility] Gastroenterology Patient Name: Leslie Love Procedure Date: 03/31/2021 9:26 AM MRN: [id number] Account #: [id number] Date of Birth: 1945/05/24 Admit Type: Outpatient Age: 76 Room: [facility] ENDO ROOM 3 Gender: Female Note Status: Finalized Instrument Name: Altamese Cabal Endoscope [id number] Procedure:             Upper GI endoscopy Indications:           Dysphagia Providers:             Andrey Farmer MD, MD Referring MD:          Leonie Douglas. Doy Hutching, MD (Referring MD) Medicines:             Monitored Anesthesia Care Complications:         No immediate complications. Estimated blood loss:                         Minimal. Procedure:             Pre-Anesthesia Assessment:                        - Prior to the procedure, a History and Physical was                         performed, and patient medications and allergies were                         reviewed. The patient is competent. The risks and                         benefits of the procedure and the sedation options and                         risks were discussed with the patient. All questions                         were answered and informed consent was obtained.                         Patient identification and proposed procedure were                         verified by the physician, the nurse, the                         anesthesiologist, the anesthetist and the technician                         in the endoscopy suite. Mental Status Examination:                         alert and oriented. Airway Examination: normal                         oropharyngeal airway and neck mobility. Respiratory                         Examination: clear to auscultation. CV Examination:  normal. Prophylactic Antibiotics: The patient does not                         require prophylactic antibiotics. Prior                         Anticoagulants: The patient has taken no previous                          anticoagulant or antiplatelet agents except for                         aspirin. ASA Grade Assessment: II - A patient with                         mild systemic disease. After reviewing the risks and                         benefits, the patient was deemed in satisfactory                         condition to undergo the procedure. The anesthesia                         plan was to use monitored anesthesia care (MAC).                         Immediately prior to administration of medications,                         the patient was re-assessed for adequacy to receive                         sedatives. The heart rate, respiratory rate, oxygen                         saturations, blood pressure, adequacy of pulmonary                         ventilation, and response to care were monitored                         throughout the procedure. The physical status of the                         patient was re-assessed after the procedure.                        After obtaining informed consent, the endoscope was                         passed under direct vision. Throughout the procedure,                         the patient's blood pressure, pulse, and oxygen                         saturations were monitored continuously. The Endoscope  was introduced through the mouth, and advanced to the                         second part of duodenum. The upper GI endoscopy was                         accomplished without difficulty. The patient tolerated                         the procedure well. Findings:      The examined esophagus was normal. Biopsies were obtained from the       proximal and distal esophagus with cold forceps for histology of       suspected eosinophilic esophagitis. Estimated blood loss was minimal.      The entire examined stomach was normal.      The examined duodenum was normal. Impression:            - Normal esophagus. Biopsied.                        - Normal  stomach.                        - Normal examined duodenum. Recommendation:        - Discharge patient to home.                        - Resume previous diet.                        - Continue present medications.                        - Await pathology results.                        - Perform ambulatory esophageal manometry if symptoms                         persist. Procedure Code(s):     --- Professional ---                        [phone], Esophagogastroduodenoscopy, flexible,                         transoral; with biopsy, single or multiple Diagnosis Code(s):     --- Professional ---                        R13.10, Dysphagia, unspecified CPT copyright 2019 American Medical Association. All rights reserved. The codes documented in this report are preliminary and upon coder review may  be revised to meet current compliance requirements. Andrey Farmer MD, MD 03/31/2021 9:52:14 AM Number of Addenda: 0 Note Initiated On: 03/31/2021 9:26 AM Estimated Blood Loss:  Estimated blood loss was minimal.      [facility]

## 2021-03-31 NOTE — Interval H&P Note (Signed)
History and Physical Interval Note:  03/31/2021 9:35 AM  Leslie Love  has presented today for surgery, with the diagnosis of Dysphagia.  The various methods of treatment have been discussed with the patient and family. After consideration of risks, benefits and other options for treatment, the patient has consented to  Procedure(s): ESOPHAGOGASTRODUODENOSCOPY (EGD) WITH PROPOFOL (N/A) as a surgical intervention.  The patient's history has been reviewed, patient examined, no change in status, stable for surgery.  I have reviewed the patient's chart and labs.  Questions were answered to the patient's satisfaction.     [name]  Ok to proceed with EGD

## 2021-03-31 NOTE — Transfer of Care (Signed)
Immediate Anesthesia Transfer of Care Note  Patient: Leslie Love  Procedure(s) Performed: ESOPHAGOGASTRODUODENOSCOPY (EGD) WITH PROPOFOL  Patient Location: PACU  Anesthesia Type:General  Level of Consciousness: awake and alert   Airway & Oxygen Therapy: Patient Spontanous Breathing  Post-op Assessment: Report given to RN and Post -op Vital signs reviewed and stable  Post vital signs: Reviewed and stable  Last Vitals:  Vitals Value Taken Time  BP 113/37 03/31/21 0955  Temp 36.2 C 03/31/21 0953  Pulse 80 03/31/21 0955  Resp 13 03/31/21 0955  SpO2 100 % 03/31/21 0955    Last Pain:  Vitals:   03/31/21 0953  TempSrc: Tympanic  PainSc: Asleep         Complications: No notable events documented.

## 2021-03-31 NOTE — H&P (Signed)
Outpatient short stay form Pre-procedure 03/31/2021  Leslie Rubenstein, MD  Primary Physician: Idelle Crouch, MD  Reason for visit:  Choking  History of present illness:    76 y/o lady with history of hypertension here for EGD for dysphagia/choking to solids and liquids. Can happen even when not eating. No blood thinners. Sister had colon cancer in her 19's. No neck surgeries. History of appendectomy. Barium swallow study with no stricture.    Current Facility-Administered Medications:    0.9 %  sodium chloride infusion, , Intravenous, Continuous, Heydi Swango, Hilton Cork, MD, Last Rate: 20 mL/hr at 03/31/21 4854, Restarted at 03/31/21 6270  Medications Prior to Admission  Medication Sig Dispense Refill Last Dose   ALPRAZolam (XANAX) 0.25 MG tablet Take 0.25 mg by mouth every 8 (eight) hours.  5 03/30/2021   aspirin EC 81 MG tablet Take 81 mg by mouth daily.    Past Week   bisoprolol-hydrochlorothiazide (ZIAC) 5-6.25 MG per tablet Take 1 tablet by mouth daily.    03/31/2021 at 0630   Calcium Carbonate-Vitamin D (CALCIUM + D PO) Take by mouth.   03/30/2021   Cyanocobalamin 1000 MCG TBCR Take 1 tablet by mouth daily.    03/30/2021   EPINEPHrine 0.3 mg/0.3 mL IJ SOAJ injection EpiPen 2-Pak 0.3 mg/0.3 mL injection, auto-injector   Past Month   omeprazole (PRILOSEC) 20 MG capsule Take 20 mg by mouth 2 (two) times daily before a meal.    03/30/2021   pravastatin (PRAVACHOL) 40 MG tablet Take 40 mg by mouth daily.    03/30/2021   promethazine (PHENERGAN) 12.5 MG tablet Take 1 tablet (12.5 mg total) by mouth every 6 (six) hours as needed for nausea or vomiting. 30 tablet 0 03/30/2021   sucralfate (CARAFATE) 1 g tablet Take 1 tablet (1 g total) by mouth 4 (four) times daily. 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    Physical Exam  Gen: Alert, oriented. Appears stated age.  HEENT: PERRLA. Lungs: No respiratory distress CV: RRR Abd: soft, benign, no masses Ext: No edema    Planned procedures: Proceed with EGD. The patient understands the nature of the planned procedure, indications, risks, alternatives and potential complications including but not limited to bleeding, infection, perforation, damage to internal organs and possible oversedation/side effects from anesthesia. The patient agrees and gives consent to proceed.  Please refer to procedure notes for findings, recommendations and patient disposition/instructions.     Leslie Rubenstein, MD [facility] Gastroenterology

## 2021-03-31 NOTE — Anesthesia Preprocedure Evaluation (Signed)
Anesthesia Evaluation  Patient identified by MRN, date of birth, ID band Patient awake    Reviewed: Allergy & Precautions, NPO status , Patient's Chart, lab work & pertinent test results  History of Anesthesia Complications (+) PONV and history of anesthetic complications  Airway Mallampati: III  TM Distance: >3 FB Neck ROM: full    Dental  (+) Chipped   Pulmonary neg shortness of breath, former smoker,    Pulmonary exam normal        Cardiovascular Exercise Tolerance: Good hypertension, (-) anginaNormal cardiovascular exam     Neuro/Psych negative neurological ROS  negative psych ROS   GI/Hepatic Neg liver ROS, GERD  Controlled,  Endo/Other  negative endocrine ROS  Renal/GU negative Renal ROS  negative genitourinary   Musculoskeletal   Abdominal   Peds  Hematology negative hematology ROS (+)   Anesthesia Other Findings Past Medical History: No date: Anemia 2013: 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 No date: Cancer ([place])     Comment:  Left breast cancer 2014, lumpectomy No date: Chronic insomnia No date: Complication of anesthesia No date: GERD (gastroesophageal reflux disease) No date: History of gastritis No date: History of shingles No date: Hyperlipidemia No date: Hypertension No date: Nocturnal leg cramps No date: Osteopenia No date: Osteopenia No date: Palpitations No date: Personal history of radiation therapy No date: PONV (postoperative nausea and vomiting) No date: Vitamin B 12 deficiency No date: Vitamin D deficiency  Past Surgical History: No date: ABDOMINAL HYSTERECTOMY No date: APPENDECTOMY 1980: AUGMENTATION MAMMAPLASTY; Bilateral     Comment:  removed due to bilateral implant rupture in 02/2016 left               nipple removed 2013: BREAST BIOPSY; Left     Comment:  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 09/16/2018: BREAST BIOPSY; Left     Comment:  stero calcs x clip benign   09/16/2018: BREAST BIOPSY; Left     Comment:  stereo calcs ribbon clip benign  04/10/2016: BREAST IMPLANT REMOVAL; Bilateral     Comment:  Procedure: REMOVAL BILATERAL  BREAST IMPLANTS AND               IMPLANT MATERIAL;  Surgeon: Irene Limbo, MD;                Location: [place];  Service: Plastics;               Laterality: Bilateral; 2013: BREAST LUMPECTOMY; Left     Comment:  clear margins No date: BREAST LUMPECTOMY W/ NEEDLE LOCALIZATION; Left No date: BREAST SURGERY 04/10/2016: CAPSULECTOMY; Bilateral     Comment:  Procedure: CAPSULECTOMY;  Surgeon: Irene Limbo, MD;              Location: [place];  Service: Plastics;               Laterality: Bilateral; No date: COLONOSCOPY 12/31/2014: COLONOSCOPY; N/A     Comment:  Procedure: COLONOSCOPY;  Surgeon: Hulen Luster, MD;                Location: [facility] ENDOSCOPY;  Service: Gastroenterology;                Laterality: N/A; 02/04/2016: COLONOSCOPY WITH PROPOFOL; N/A     Comment:  Procedure: COLONOSCOPY WITH PROPOFOL;  Surgeon: Lollie Sails, MD;  Location: [facility] ENDOSCOPY;  Service:  Endoscopy;  Laterality: N/A; No date: ESOPHAGOGASTRODUODENOSCOPY 04/20/2016: INCISION AND DRAINAGE OF WOUND; Left     Comment:  Procedure: IRRIGATION AND DEBRIDEMENT OF LEFT BREAST;                Surgeon: Irene Limbo, MD;  Location: [place];  Service: Plastics;  Laterality: Left; No date: KNEE ARTHROSCOPY; Left     Comment:  partial medial and lateral menisceoctomy 04/10/2016: MASTOPEXY; Bilateral     Comment:  Procedure: MASTOPEXY;  Surgeon: Irene Limbo, MD;                Location: [place];  Service: Plastics;               Laterality: Bilateral;  BMI    Body Mass Index: 28.84 kg/m      Reproductive/Obstetrics negative OB ROS                             Anesthesia Physical Anesthesia  Plan  ASA: 3  Anesthesia Plan: General   Post-op Pain Management:    Induction: Intravenous  PONV Risk Score and Plan: Propofol infusion and TIVA  Airway Management Planned: Natural Airway and Nasal Cannula  Additional Equipment:   Intra-op Plan:   Post-operative Plan:   Informed Consent: I have reviewed the patients History and Physical, chart, labs and discussed the procedure including the risks, benefits and alternatives for the proposed anesthesia with the patient or authorized representative who has indicated his/her understanding and acceptance.     Dental Advisory Given  Plan Discussed with: Anesthesiologist, CRNA and Surgeon  Anesthesia Plan Comments: (Patient consented for risks of anesthesia including but not limited to:  - adverse reactions to medications - risk of airway placement if required - damage to eyes, teeth, lips or other oral mucosa - nerve damage due to positioning  - sore throat or hoarseness - Damage to heart, brain, nerves, lungs, other parts of body or loss of life  Patient voiced understanding.)        Anesthesia Quick Evaluation

## 2021-04-01 ENCOUNTER — Encounter: Payer: Self-pay | Admitting: Gastroenterology

## 2021-04-01 LAB — SURGICAL PATHOLOGY

## 2021-07-11 ENCOUNTER — Other Ambulatory Visit: Payer: Self-pay | Admitting: Internal Medicine

## 2021-07-11 DIAGNOSIS — R1032 Left lower quadrant pain: Secondary | ICD-10-CM

## 2021-07-22 ENCOUNTER — Ambulatory Visit
Admission: RE | Admit: 2021-07-22 | Discharge: 2021-07-22 | Disposition: A | Discharge status: Home / Self Care | Payer: Medicare Other | Source: Ambulatory Visit | Attending: Internal Medicine | Admitting: Internal Medicine

## 2021-07-22 DIAGNOSIS — R1032 Left lower quadrant pain: Secondary | ICD-10-CM

## 2021-07-22 MED ORDER — IOPAMIDOL (ISOVUE-300) INJECTION 61%
100.0000 mL | Freq: Once | INTRAVENOUS | Status: AC | PRN
  Administered 2021-07-22: 100 mL via INTRAVENOUS

## 2021-07-31 ENCOUNTER — Other Ambulatory Visit
Admission: RE | Admit: 2021-07-31 | Discharge: 2021-07-31 | Disposition: A | Discharge status: Home / Self Care | Payer: Medicare Other | Source: Ambulatory Visit | Attending: Cardiology | Admitting: Cardiology

## 2021-07-31 DIAGNOSIS — R9439 Abnormal result of other cardiovascular function study: Secondary | ICD-10-CM | POA: Diagnosis not present

## 2021-07-31 DIAGNOSIS — Z01818 Encounter for other preprocedural examination: Secondary | ICD-10-CM | POA: Diagnosis not present

## 2021-07-31 DIAGNOSIS — R079 Chest pain, unspecified: Secondary | ICD-10-CM | POA: Diagnosis present

## 2021-07-31 DIAGNOSIS — I10 Essential (primary) hypertension: Secondary | ICD-10-CM | POA: Diagnosis not present

## 2021-07-31 LAB — BRAIN NATRIURETIC PEPTIDE: B Natriuretic Peptide: 16.2 pg/mL (ref 0.0–100.0)

## 2021-08-19 ENCOUNTER — Other Ambulatory Visit: Payer: Self-pay

## 2021-08-19 ENCOUNTER — Ambulatory Visit
Admission: RE | Admit: 2021-08-19 | Discharge: 2021-08-19 | Disposition: A | Discharge status: Home / Self Care | Payer: Medicare Other | Attending: Cardiology | Admitting: Cardiology

## 2021-08-19 ENCOUNTER — Encounter: Payer: Self-pay | Admitting: Cardiology

## 2021-08-19 ENCOUNTER — Encounter
Admission: RE | Disposition: A | Discharge status: Home / Self Care | Payer: Self-pay | Source: Home / Self Care | Attending: Cardiology

## 2021-08-19 DIAGNOSIS — R9430 Abnormal result of cardiovascular function study, unspecified: Secondary | ICD-10-CM | POA: Diagnosis not present

## 2021-08-19 DIAGNOSIS — I25110 Atherosclerotic heart disease of native coronary artery with unstable angina pectoris: Secondary | ICD-10-CM | POA: Insufficient documentation

## 2021-08-19 DIAGNOSIS — Z01818 Encounter for other preprocedural examination: Secondary | ICD-10-CM

## 2021-08-19 DIAGNOSIS — I200 Unstable angina: Secondary | ICD-10-CM

## 2021-08-19 HISTORY — PX: LEFT HEART CATH AND CORONARY ANGIOGRAPHY: CATH118249

## 2021-08-19 SURGERY — LEFT HEART CATH AND CORONARY ANGIOGRAPHY
Anesthesia: Moderate Sedation

## 2021-08-19 MED ORDER — HEPARIN SODIUM (PORCINE) 1000 UNIT/ML IJ SOLN
INTRAMUSCULAR | Status: AC
  Filled 2021-08-19: qty 10

## 2021-08-19 MED ORDER — ACETAMINOPHEN 325 MG PO TABS: 650.0000 mg | ORAL_TABLET | ORAL | Status: DC | PRN

## 2021-08-19 MED ORDER — SODIUM CHLORIDE 0.9 % WEIGHT BASED INFUSION
3.0000 mL/kg/h | INTRAVENOUS | Status: AC
  Administered 2021-08-19: 3 mL/kg/h via INTRAVENOUS

## 2021-08-19 MED ORDER — IOHEXOL 300 MG/ML  SOLN
INTRAMUSCULAR | Status: DC | PRN
  Administered 2021-08-19: 91 mL

## 2021-08-19 MED ORDER — VERAPAMIL HCL 2.5 MG/ML IV SOLN
INTRAVENOUS | Status: AC
  Filled 2021-08-19: qty 2

## 2021-08-19 MED ORDER — SODIUM CHLORIDE 0.9% FLUSH: 3.0000 mL | INTRAVENOUS | Status: DC | PRN

## 2021-08-19 MED ORDER — FENTANYL CITRATE (PF) 100 MCG/2ML IJ SOLN
INTRAMUSCULAR | Status: DC | PRN
  Administered 2021-08-19: 25 ug via INTRAVENOUS

## 2021-08-19 MED ORDER — HEPARIN SODIUM (PORCINE) 1000 UNIT/ML IJ SOLN
INTRAMUSCULAR | Status: DC | PRN
  Administered 2021-08-19: 4000 [IU] via INTRAVENOUS

## 2021-08-19 MED ORDER — LABETALOL HCL 5 MG/ML IV SOLN: 10.0000 mg | INTRAVENOUS | Status: DC | PRN

## 2021-08-19 MED ORDER — FENTANYL CITRATE (PF) 100 MCG/2ML IJ SOLN
INTRAMUSCULAR | Status: AC
  Filled 2021-08-19: qty 2

## 2021-08-19 MED ORDER — SODIUM CHLORIDE 0.9% FLUSH: 3.0000 mL | Freq: Two times a day (BID) | INTRAVENOUS | Status: DC

## 2021-08-19 MED ORDER — LIDOCAINE HCL (PF) 1 % IJ SOLN
INTRAMUSCULAR | Status: DC | PRN
  Administered 2021-08-19: 2 mL

## 2021-08-19 MED ORDER — SODIUM CHLORIDE 0.9 % IV SOLN: 250.0000 mL | INTRAVENOUS | Status: DC | PRN

## 2021-08-19 MED ORDER — HEPARIN (PORCINE) IN NACL 1000-0.9 UT/500ML-% IV SOLN
INTRAVENOUS | Status: AC
  Filled 2021-08-19: qty 1000

## 2021-08-19 MED ORDER — HEPARIN (PORCINE) IN NACL 1000-0.9 UT/500ML-% IV SOLN
INTRAVENOUS | Status: DC | PRN
  Administered 2021-08-19 (×2): 500 mL

## 2021-08-19 MED ORDER — ASPIRIN 81 MG PO CHEW
CHEWABLE_TABLET | ORAL | Status: AC
  Filled 2021-08-19: qty 1

## 2021-08-19 MED ORDER — LIDOCAINE HCL 1 % IJ SOLN
INTRAMUSCULAR | Status: AC
  Filled 2021-08-19: qty 20

## 2021-08-19 MED ORDER — ONDANSETRON HCL 4 MG/2ML IJ SOLN: 4.0000 mg | Freq: Four times a day (QID) | INTRAMUSCULAR | Status: DC | PRN

## 2021-08-19 MED ORDER — HYDRALAZINE HCL 20 MG/ML IJ SOLN: 10.0000 mg | INTRAMUSCULAR | Status: DC | PRN

## 2021-08-19 MED ORDER — MIDAZOLAM HCL 2 MG/2ML IJ SOLN
INTRAMUSCULAR | Status: DC | PRN
  Administered 2021-08-19: 1 mg via INTRAVENOUS

## 2021-08-19 MED ORDER — SODIUM CHLORIDE 0.9 % WEIGHT BASED INFUSION
1.0000 mL/kg/h | INTRAVENOUS | Status: DC
  Administered 2021-08-19: 1 mL/kg/h via INTRAVENOUS

## 2021-08-19 MED ORDER — VERAPAMIL HCL 2.5 MG/ML IV SOLN
INTRAVENOUS | Status: DC | PRN
  Administered 2021-08-19: 2.5 mg via INTRA_ARTERIAL

## 2021-08-19 MED ORDER — ASPIRIN 81 MG PO CHEW
81.0000 mg | CHEWABLE_TABLET | ORAL | Status: AC
  Administered 2021-08-19: 81 mg via ORAL

## 2021-08-19 MED ORDER — MIDAZOLAM HCL 2 MG/2ML IJ SOLN
INTRAMUSCULAR | Status: AC
  Filled 2021-08-19: qty 2

## 2021-08-19 SURGICAL SUPPLY — 13 items
BAND CMPR LRG ZPHR (HEMOSTASIS) ×1
BAND ZEPHYR COMPRESS 30 LONG (HEMOSTASIS) ×1 IMPLANT
CATH 5FR JL3.5 JR4 ANG PIG MP (CATHETERS) ×1 IMPLANT
DRAPE BRACHIAL (DRAPES) ×1 IMPLANT
GLIDESHEATH SLEND SS 6F .021 (SHEATH) ×1 IMPLANT
GUIDEWIRE INQWIRE 1.5J.035X260 (WIRE) IMPLANT
INQWIRE 1.5J .035X260CM (WIRE) ×2
KIT SYRINGE INJ CVI SPIKEX1 (MISCELLANEOUS) ×1 IMPLANT
PACK CARDIAC CATH (CUSTOM PROCEDURE TRAY) ×3 IMPLANT
PROTECTION STATION PRESSURIZED (MISCELLANEOUS) ×2
SET ATX SIMPLICITY (MISCELLANEOUS) ×1 IMPLANT
STATION PROTECTION PRESSURIZED (MISCELLANEOUS) IMPLANT
WIRE HITORQ VERSACORE ST 145CM (WIRE) ×1 IMPLANT

## 2021-10-07 ENCOUNTER — Other Ambulatory Visit: Payer: Self-pay | Admitting: Internal Medicine

## 2021-10-07 DIAGNOSIS — Z1231 Encounter for screening mammogram for malignant neoplasm of breast: Secondary | ICD-10-CM

## 2021-11-19 ENCOUNTER — Ambulatory Visit
Admission: RE | Admit: 2021-11-19 | Discharge: 2021-11-19 | Disposition: A | Discharge status: Home / Self Care | Payer: Medicare Other | Source: Ambulatory Visit | Attending: Internal Medicine | Admitting: Internal Medicine

## 2021-11-19 DIAGNOSIS — Z1231 Encounter for screening mammogram for malignant neoplasm of breast: Secondary | ICD-10-CM | POA: Insufficient documentation

## 2022-10-07 ENCOUNTER — Other Ambulatory Visit: Payer: Self-pay | Admitting: Internal Medicine

## 2022-10-07 DIAGNOSIS — Z1231 Encounter for screening mammogram for malignant neoplasm of breast: Secondary | ICD-10-CM

## 2022-11-23 ENCOUNTER — Ambulatory Visit
Admission: RE | Admit: 2022-11-23 | Discharge: 2022-11-23 | Disposition: A | Discharge status: Home / Self Care | Payer: Medicare Other | Source: Ambulatory Visit | Attending: Internal Medicine | Admitting: Internal Medicine

## 2022-11-23 DIAGNOSIS — Z1231 Encounter for screening mammogram for malignant neoplasm of breast: Secondary | ICD-10-CM | POA: Insufficient documentation

## 2023-01-30 ENCOUNTER — Other Ambulatory Visit: Payer: Self-pay

## 2023-01-30 DIAGNOSIS — I10 Essential (primary) hypertension: Secondary | ICD-10-CM | POA: Insufficient documentation

## 2023-01-30 DIAGNOSIS — R319 Hematuria, unspecified: Secondary | ICD-10-CM | POA: Diagnosis present

## 2023-01-30 DIAGNOSIS — N390 Urinary tract infection, site not specified: Secondary | ICD-10-CM | POA: Diagnosis not present

## 2023-01-30 LAB — CBC WITH DIFFERENTIAL/PLATELET
Abs Immature Granulocytes: 0.02 10*3/uL (ref 0.00–0.07)
Basophils Absolute: 0.1 10*3/uL (ref 0.0–0.1)
Basophils Relative: 1 %
Eosinophils Absolute: 0.4 10*3/uL (ref 0.0–0.5)
Eosinophils Relative: 5 %
HCT: 37.9 % (ref 36.0–46.0)
Hemoglobin: 12.4 g/dL (ref 12.0–15.0)
Immature Granulocytes: 0 %
Lymphocytes Relative: 24 %
Lymphs Abs: 1.7 10*3/uL (ref 0.7–4.0)
MCH: 27.4 pg (ref 26.0–34.0)
MCHC: 32.7 g/dL (ref 30.0–36.0)
MCV: 83.7 fL (ref 80.0–100.0)
Monocytes Absolute: 0.4 10*3/uL (ref 0.1–1.0)
Monocytes Relative: 6 %
Neutro Abs: 4.6 10*3/uL (ref 1.7–7.7)
Neutrophils Relative %: 64 %
Platelets: 238 10*3/uL (ref 150–400)
RBC: 4.53 MIL/uL (ref 3.87–5.11)
RDW: 13.8 % (ref 11.5–15.5)
WBC: 7.2 10*3/uL (ref 4.0–10.5)
nRBC: 0 % (ref 0.0–0.2)

## 2023-01-30 LAB — URINALYSIS, ROUTINE W REFLEX MICROSCOPIC
Bilirubin Urine: NEGATIVE
Glucose, UA: NEGATIVE mg/dL
Ketones, ur: 5 mg/dL — AB
Nitrite: POSITIVE — AB
Protein, ur: 100 mg/dL — AB
RBC / HPF: >50 RBC/hpf (ref 0–5)
Specific Gravity, Urine: 1.005 (ref 1.005–1.030)
Squamous Epithelial / HPF: 0 /[HPF] (ref 0–5)
WBC, UA: >50 WBC/hpf (ref 0–5)
pH: 6 (ref 5.0–8.0)

## 2023-01-30 LAB — BASIC METABOLIC PANEL
Anion gap: 14 (ref 5–15)
BUN: 8 mg/dL (ref 8–23)
CO2: 24 mmol/L (ref 22–32)
Calcium: 9 mg/dL (ref 8.9–10.3)
Chloride: 97 mmol/L — ABNORMAL LOW (ref 98–111)
Creatinine, Ser: 0.68 mg/dL (ref 0.44–1.00)
GFR, Estimated: >60 mL/min (ref >60)
Glucose, Bld: 118 mg/dL — ABNORMAL HIGH (ref 70–99)
Potassium: 3.2 mmol/L — ABNORMAL LOW (ref 3.5–5.1)
Sodium: 135 mmol/L (ref 135–145)

## 2023-01-30 NOTE — ED Triage Notes (Signed)
Pt reports dysuria and hematuria that developed today. Pt is also c/o lower abd discomfort, pt states she is taking pyridium and it is making her urine orange but she is noticing blood on her toilet paper when she wipes. Pt denies taking blood thinners.

## 2023-01-31 ENCOUNTER — Emergency Department
Admission: EM | Admit: 2023-01-31 | Discharge: 2023-01-31 | Disposition: A | Discharge status: Home / Self Care | Payer: Medicare Other | Attending: Emergency Medicine | Admitting: Emergency Medicine

## 2023-01-31 DIAGNOSIS — N390 Urinary tract infection, site not specified: Secondary | ICD-10-CM

## 2023-01-31 MED ORDER — CEPHALEXIN 500 MG PO CAPS: 500.0000 mg | ORAL_CAPSULE | Freq: Three times a day (TID) | ORAL | 0 refills | Status: DC

## 2023-01-31 MED ORDER — KETOROLAC TROMETHAMINE 30 MG/ML IJ SOLN
15.0000 mg | Freq: Once | INTRAMUSCULAR | Status: AC
  Administered 2023-01-31: 15 mg via INTRAVENOUS
  Filled 2023-01-31: qty 1

## 2023-01-31 MED ORDER — SODIUM CHLORIDE 0.9 % IV SOLN
1.0000 g | Freq: Once | INTRAVENOUS | Status: AC
Start: 2023-01-31 — End: 2023-01-31
  Administered 2023-01-31: 1 g via INTRAVENOUS
  Filled 2023-01-31: qty 10

## 2023-01-31 NOTE — ED Provider Notes (Signed)
[facility] Provider Note    Event Date/Time   First MD Initiated Contact with Patient 01/31/23 [phone]     (approximate)  History   Chief Complaint: Hematuria  HPI  DARCIA HAMMERLE is a 77 y.o. female with a past medical history of gastric reflux, hypertension, hyperlipidemia, urinary tract infections, presents to the emergency department for what she believes is a urinary tract infection.  According to the patient earlier today she noticed pain when she urinates, tonight she noticed a small amount of blood and cloudy urine.  Patient states a history of urinary tract infections in the past which this feels similar.  Patient denies any known fever.  States she normally calls her doctor who will call her in an antibiotic but as this is over the weekend she came to the emergency department for evaluation.  Patient describes discomfort when urinating cloudy urine and now blood in the urine tonight.  No abdominal pain.  Does state some mild lower back pain which she states again is consistent with prior UTIs.  Physical Exam   Triage Vital Signs: ED Triage Vitals  Encounter Vitals Group     BP 01/30/23 2158 (!) 148/59     Systolic BP Percentile --      Diastolic BP Percentile --      Pulse Rate 01/30/23 2155 (!) 115     Resp 01/30/23 2155 18     Temp 01/30/23 2155 (!) 97.5 F (36.4 C)     Temp Source 01/30/23 2155 Oral     SpO2 01/30/23 2155 100 %     Weight 01/30/23 2154 160 lb (72.6 kg)     Height 01/30/23 2154 5\' 4"  (1.626 m)     Head Circumference --      Peak Flow --      Pain Score 01/30/23 2154 8     Pain Loc --      Pain Education --      Exclude from Growth Chart --     Most recent vital signs: Vitals:   01/30/23 2158 01/31/23 0324  BP: (!) 148/59 (!) 126/55  Pulse:  72  Resp:  17  Temp:  98.1 F (36.7 C)  SpO2:  98%    General: Awake, no distress.  CV:  Good peripheral perfusion.  Regular rate and rhythm  Resp:  Normal effort.  Equal  breath sounds bilaterally.  Abd:  No distention.  Soft, nontender.  No rebound or guarding.  Benign abdomen.  ED Results / Procedures / Treatments   MEDICATIONS ORDERED IN ED: Medications  cefTRIAXone (ROCEPHIN) 1 g in sodium chloride 0.9 % 100 mL IVPB (has no administration in time range)  ketorolac (TORADOL) 30 MG/ML injection 15 mg (has no administration in time range)     IMPRESSION / MDM / ASSESSMENT AND PLAN / ED COURSE  I reviewed the triage vital signs and the nursing notes.  Patient's presentation is most consistent with acute presentation with potential threat to life or bodily function.  Patient presents emergency department for dysuria and blood in the urine.  Patient states a history of urinary tract infections in the past which this feels identical.  Patient's lab work shows a reassuring CBC with a normal white blood cell count, reassuring chemistry.  Patient's urinalysis does show gross infection nitrite positive greater than 50 red cells white cells and many bacteria.  We will dose IV Rocephin, IV Toradol and reassess.  As long as the patient continues  to feel well anticipate likely discharge home on oral antibiotics.  Patient agreeable to plan of care.  Will follow-up with her doctor.  FINAL CLINICAL IMPRESSION(S) / ED DIAGNOSES   Urinary tract infection    Note:  This document was prepared using Dragon voice recognition software and may include unintentional dictation errors.   Minna Antis, MD 01/31/23 [phone]

## 2023-01-31 NOTE — Discharge Instructions (Signed)
Please take antibiotics as prescribed for their entire course.  Please follow-up with your primary care doctor within the next several days for recheck/reevaluation.  Return to the emergency department for any worsening symptoms, development of fever, vomiting unable to keep down your antibiotics, or any other symptom personally concerning to yourself.

## 2023-02-02 LAB — URINE CULTURE: Culture: >=100000 — AB

## 2023-03-23 ENCOUNTER — Other Ambulatory Visit: Payer: Self-pay | Admitting: Physician Assistant

## 2023-03-23 ENCOUNTER — Ambulatory Visit
Admission: RE | Admit: 2023-03-23 | Discharge: 2023-03-23 | Disposition: A | Discharge status: Home / Self Care | Payer: Medicare Other | Source: Ambulatory Visit | Attending: Physician Assistant | Admitting: Physician Assistant

## 2023-03-23 DIAGNOSIS — R1111 Vomiting without nausea: Secondary | ICD-10-CM

## 2023-03-23 DIAGNOSIS — G459 Transient cerebral ischemic attack, unspecified: Secondary | ICD-10-CM

## 2023-03-26 ENCOUNTER — Ambulatory Visit: Admission: RE | Admit: 2023-03-26 | Payer: Medicare Other | Source: Ambulatory Visit

## 2023-07-15 ENCOUNTER — Other Ambulatory Visit: Payer: Self-pay

## 2023-07-15 ENCOUNTER — Emergency Department

## 2023-07-15 ENCOUNTER — Emergency Department
Admission: EM | Admit: 2023-07-15 | Discharge: 2023-07-15 | Disposition: A | Discharge status: Home / Self Care | Attending: Emergency Medicine | Admitting: Emergency Medicine

## 2023-07-15 DIAGNOSIS — W01190A Fall on same level from slipping, tripping and stumbling with subsequent striking against furniture, initial encounter: Secondary | ICD-10-CM | POA: Diagnosis not present

## 2023-07-15 DIAGNOSIS — I10 Essential (primary) hypertension: Secondary | ICD-10-CM | POA: Diagnosis not present

## 2023-07-15 DIAGNOSIS — W19XXXA Unspecified fall, initial encounter: Secondary | ICD-10-CM

## 2023-07-15 DIAGNOSIS — K5730 Diverticulosis of large intestine without perforation or abscess without bleeding: Secondary | ICD-10-CM | POA: Insufficient documentation

## 2023-07-15 DIAGNOSIS — M545 Low back pain, unspecified: Secondary | ICD-10-CM | POA: Diagnosis not present

## 2023-07-15 DIAGNOSIS — Z853 Personal history of malignant neoplasm of breast: Secondary | ICD-10-CM | POA: Insufficient documentation

## 2023-07-15 DIAGNOSIS — R310 Gross hematuria: Secondary | ICD-10-CM | POA: Insufficient documentation

## 2023-07-15 DIAGNOSIS — Y92010 Kitchen of single-family (private) house as the place of occurrence of the external cause: Secondary | ICD-10-CM | POA: Insufficient documentation

## 2023-07-15 DIAGNOSIS — Y9301 Activity, walking, marching and hiking: Secondary | ICD-10-CM | POA: Insufficient documentation

## 2023-07-15 DIAGNOSIS — S0990XA Unspecified injury of head, initial encounter: Secondary | ICD-10-CM | POA: Diagnosis present

## 2023-07-15 DIAGNOSIS — R319 Hematuria, unspecified: Secondary | ICD-10-CM | POA: Diagnosis present

## 2023-07-15 LAB — URINALYSIS, ROUTINE W REFLEX MICROSCOPIC
Bacteria, UA: NONE SEEN
Bilirubin Urine: NEGATIVE
Glucose, UA: NEGATIVE mg/dL
Ketones, ur: NEGATIVE mg/dL
Leukocytes,Ua: NEGATIVE
Nitrite: NEGATIVE
Protein, ur: NEGATIVE mg/dL
RBC / HPF: >50 RBC/hpf (ref 0–5)
Specific Gravity, Urine: 1.014 (ref 1.005–1.030)
pH: 6 (ref 5.0–8.0)

## 2023-07-15 LAB — COMPREHENSIVE METABOLIC PANEL WITH GFR
ALT: 18 U/L (ref 0–44)
AST: 22 U/L (ref 15–41)
Albumin: 4.2 g/dL (ref 3.5–5.0)
Alkaline Phosphatase: 62 U/L (ref 38–126)
Anion gap: 10 (ref 5–15)
BUN: 12 mg/dL (ref 8–23)
CO2: 26 mmol/L (ref 22–32)
Calcium: 9 mg/dL (ref 8.9–10.3)
Chloride: 99 mmol/L (ref 98–111)
Creatinine, Ser: 0.79 mg/dL (ref 0.44–1.00)
GFR, Estimated: >60 mL/min (ref >60)
Glucose, Bld: 117 mg/dL — ABNORMAL HIGH (ref 70–99)
Potassium: 3.2 mmol/L — ABNORMAL LOW (ref 3.5–5.1)
Sodium: 135 mmol/L (ref 135–145)
Total Bilirubin: 0.5 mg/dL (ref 0.0–1.2)
Total Protein: 7 g/dL (ref 6.5–8.1)

## 2023-07-15 LAB — CBC WITH DIFFERENTIAL/PLATELET
Abs Immature Granulocytes: 0.02 10*3/uL (ref 0.00–0.07)
Basophils Absolute: 0.1 10*3/uL (ref 0.0–0.1)
Basophils Relative: 1 %
Eosinophils Absolute: 0.3 10*3/uL (ref 0.0–0.5)
Eosinophils Relative: 6 %
HCT: 36.1 % (ref 36.0–46.0)
Hemoglobin: 12.2 g/dL (ref 12.0–15.0)
Immature Granulocytes: 0 %
Lymphocytes Relative: 25 %
Lymphs Abs: 1.5 10*3/uL (ref 0.7–4.0)
MCH: 28 pg (ref 26.0–34.0)
MCHC: 33.8 g/dL (ref 30.0–36.0)
MCV: 82.8 fL (ref 80.0–100.0)
Monocytes Absolute: 0.4 10*3/uL (ref 0.1–1.0)
Monocytes Relative: 7 %
Neutro Abs: 3.7 10*3/uL (ref 1.7–7.7)
Neutrophils Relative %: 61 %
Platelets: 248 10*3/uL (ref 150–400)
RBC: 4.36 MIL/uL (ref 3.87–5.11)
RDW: 13.6 % (ref 11.5–15.5)
WBC: 6.1 10*3/uL (ref 4.0–10.5)
nRBC: 0 % (ref 0.0–0.2)

## 2023-07-15 MED ORDER — IOHEXOL 300 MG/ML  SOLN
100.0000 mL | Freq: Once | INTRAMUSCULAR | Status: AC | PRN
  Administered 2023-07-15: 100 mL via INTRAVENOUS

## 2023-07-15 MED ORDER — POTASSIUM CHLORIDE CRYS ER 20 MEQ PO TBCR: 40.0000 meq | EXTENDED_RELEASE_TABLET | Freq: Once | ORAL | Status: DC

## 2023-07-15 NOTE — ED Provider Notes (Signed)
 [facility] Provider Note    Event Date/Time   First MD Initiated Contact with Patient 07/15/23 1732     (approximate)   History   Chief Complaint Fall   HPI  Leslie Love is a 78 y.o. female with past medical history of hypertension, hyperlipidemia, and breast cancer who presents to the ED complaining of fall.  Patient reports that earlier this afternoon she was walking around in her kitchen when she lost her balance and fell forward, striking the side of her head on part of a chair.  She denies LOC, subsequently fell to the ground and believes she struck her back.  She did not initially seek care in the ED, but became more concerned when she noticed blood in her urine just prior to arrival.  She reports some pain in the middle of her lower back as well as her left flank, denies abdominal pain.  She is currently on antibiotics for a UTI, was having dysuria a few days ago that has since resolved and has not had any hematuria until today.     Physical Exam   Triage Vital Signs: ED Triage Vitals  Encounter Vitals Group     BP 07/15/23 1657 106/68     Girls Systolic BP Percentile --      Girls Diastolic BP Percentile --      Boys Systolic BP Percentile --      Boys Diastolic BP Percentile --      Pulse Rate 07/15/23 1657 74     Resp 07/15/23 1657 16     Temp 07/15/23 1657 97.8 F (36.6 C)     Temp Source 07/15/23 1657 Oral     SpO2 07/15/23 1657 98 %     Weight 07/15/23 1653 170 lb 3.2 oz (77.2 kg)     Height 07/15/23 1653 5' 3 (1.6 m)     Head Circumference --      Peak Flow --      Pain Score 07/15/23 1658 5     Pain Loc --      Pain Education --      Exclude from Growth Chart --     Most recent vital signs: Vitals:   07/15/23 1657 07/15/23 1915  BP: 106/68   Pulse: 74 81  Resp: 16 17  Temp: 97.8 F (36.6 C)   SpO2: 98% 100%    Constitutional: Alert and oriented. Eyes: Conjunctivae are normal. Head: Atraumatic. Nose: No  congestion/rhinnorhea. Mouth/Throat: Mucous membranes are moist.  Neck: No midline cervical spine tenderness to palpation. Cardiovascular: Normal rate, regular rhythm. Grossly normal heart sounds.  2+ radial pulses bilaterally. Respiratory: Normal respiratory effort.  No retractions. Lungs CTAB.  No chest wall tenderness to palpation. Gastrointestinal: Soft and nontender.  Left CVA tenderness noted.  No distention. Musculoskeletal: No lower extremity tenderness nor edema.  Midline lumbar spinal tenderness to palpation.  No midline thoracic spinal tenderness. Neurologic:  Normal speech and language. No gross focal neurologic deficits are appreciated.    ED Results / Procedures / Treatments   Labs (all labs ordered are listed, but only abnormal results are displayed) Labs Reviewed  COMPREHENSIVE METABOLIC PANEL WITH GFR - Abnormal; Notable for the following components:      Result Value   Potassium 3.2 (*)    Glucose, Bld 117 (*)    All other components within normal limits  URINALYSIS, ROUTINE W REFLEX MICROSCOPIC - Abnormal; Notable for the following components:   Color, Urine  STRAW (*)    APPearance CLEAR (*)    Hgb urine dipstick LARGE (*)    All other components within normal limits  CBC WITH DIFFERENTIAL/PLATELET     EKG  ED ECG REPORT I, Twilla Galea, the attending physician, personally viewed and interpreted this ECG.   Date: 07/15/2023  EKG Time: 18:34  Rate: 71  Rhythm: normal sinus rhythm  Axis: Normal  Intervals:none  ST&T Change: None  RADIOLOGY CT head reviewed and interpreted by me with no hemorrhage or midline shift.  PROCEDURES:  Critical Care performed: No  Procedures   MEDICATIONS ORDERED IN ED: Medications  potassium chloride  SA (KLOR-CON  M) CR tablet 40 mEq (has no administration in time range)  iohexol  (OMNIPAQUE ) 300 MG/ML solution 100 mL (100 mLs Intravenous Contrast Given 07/15/23 1848)     IMPRESSION / MDM / ASSESSMENT AND PLAN / ED  COURSE  I reviewed the triage vital signs and the nursing notes.                              78 y.o. female with past medical history of hypertension, hyperlipidemia, and breast cancer who presents to the ED complaining of fall earlier this afternoon where she struck her head, complains of back pain and hematuria.  Patient's presentation is most consistent with acute presentation with potential threat to life or bodily function.  Differential diagnosis includes, but is not limited to, intracranial injury, cervical spine injury, intra-abdominal injury, extremity injury.  Patient nontoxic-appearing and in no acute distress, vital signs are unremarkable.  CT head and cervical spine are negative for acute process, will check CT imaging of her abdomen/pelvis as well as her lumbar spine given CVA tenderness with hematuria and lumbar spinal tenderness.  No evidence of traumatic injury to her thorax or extremities, patient declines pain medication.  Labs with mild hypokalemia but no significant anemia, leukocytosis, electrolyte abnormality, or AKI.  LFTs are unremarkable.  CT abdomen/pelvis as well as CT of lumbar spine are negative for acute traumatic injury.  Patient does have small nodule in her bladder that will require outpatient follow-up with urology.  Urinalysis shows hematuria but no signs of infection, patient advised of need to follow-up with urology and was counseled to return to the ED for new or worsening symptoms.  Patient agrees with plan.      FINAL CLINICAL IMPRESSION(S) / ED DIAGNOSES   Final diagnoses:  Fall, initial encounter  Gross hematuria     Rx / DC Orders   ED Discharge Orders     None        Note:  This document was prepared using Dragon voice recognition software and may include unintentional dictation errors.   Twilla Galea, MD 07/15/23 2014

## 2023-07-15 NOTE — ED Triage Notes (Signed)
 Brought from [facility]. Fall and hit left side of face. Reports urinating blood after fall. A&O x4 per [facility]  [facility] vitals: 129/66 b/p 75pulse 99% O2 97.3oral

## 2023-07-15 NOTE — ED Triage Notes (Addendum)
 Pt to ed from [facility] for a fall that happened earlier. Pt doesn't remember how she fell. Pt did strike her face when falling. Pt is caox4, in no acute distress and ambulatory in triage. Pt denies thinners, denies LOC. Pt is currently on antibiotics for a UTI.

## 2023-08-10 ENCOUNTER — Ambulatory Visit: Admitting: Urology

## 2023-08-10 DIAGNOSIS — R310 Gross hematuria: Secondary | ICD-10-CM | POA: Diagnosis not present

## 2023-08-10 DIAGNOSIS — D494 Neoplasm of unspecified behavior of bladder: Secondary | ICD-10-CM

## 2023-08-10 LAB — MICROSCOPIC EXAMINATION

## 2023-08-10 LAB — URINALYSIS, COMPLETE
Bilirubin, UA: NEGATIVE
Glucose, UA: NEGATIVE
Leukocytes,UA: NEGATIVE
Nitrite, UA: NEGATIVE
Protein,UA: NEGATIVE
RBC, UA: NEGATIVE
Specific Gravity, UA: 1.02 (ref 1.005–1.030)
Urobilinogen, Ur: 0.2 mg/dL (ref 0.2–1.0)
pH, UA: 6 (ref 5.0–7.5)

## 2023-08-10 NOTE — Patient Instructions (Signed)
 Transurethral Resection of Bladder Tumor  Transurethral resection of a bladder tumor is the removal (resection) of cancerous tissue (tumor) from the inside wall of the bladder. The bladder is the organ that holds urine. The tumor is removed through the tube that carries urine out of the body (urethra). In a transurethral resection, a thin telescope with a light, a tiny camera, and an electric cutting edge (resectoscope) is passed through the urethra. In men, the opening of the urethra is at the end of the penis. In women, it is just above the opening of the vagina. Tell a health care provider about: Any allergies you have. All medicines you are taking, including vitamins, herbs, eye drops, creams, and over-the-counter medicines. Any problems you or family members have had with anesthetic medicines. Any bleeding problems you have. Any surgeries you have had. Any medical conditions you have, including recent urinary tract infections. Whether you are pregnant or may be pregnant. What are the risks? Generally, this is a safe procedure. However, problems may occur, including: Infection. Bleeding. Allergic reactions to medicines. Damage to nearby structures or organs. Difficulty urinating from blockage of the urethra or not being able to urinate (urinary retention). Deep vein thrombosis. This is a blood clot that can develop in your leg. Recurring cancer. What happens before the procedure? When to stop eating and drinking Follow instructions from your health care provider about what you may eat and drink before your procedure. These may include: 8 hours before your procedure Stop eating most foods. Do not eat meat, fried foods, or fatty foods. Eat only light foods, such as toast or crackers. All liquids are okay except energy drinks and alcohol. 6 hours before your procedure Stop eating. Drink only clear liquids, such as water, clear fruit juice, black coffee, plain tea, and sports  drinks. Do not drink energy drinks or alcohol. 2 hours before your procedure Stop drinking all liquids. You may be allowed to take medicines with small sips of water. Medicines Ask your health care provider about: Changing or stopping your regular medicines. This is especially important if you are taking diabetes medicines or blood thinners. Taking medicines such as aspirin and ibuprofen. These medicines can thin your blood. Do not take these medicines unless your health care provider tells you to take them. Taking over-the-counter medicines, vitamins, herbs, and supplements. General instructions If you will be going home right after the procedure, plan to have a responsible adult: Take you home from the hospital or clinic. You will not be allowed to drive. Care for you for the time you are told. Ask your health care provider what steps will be taken to help prevent infection. These steps may include: Washing skin with a germ-killing soap. Taking antibiotic medicine. Do not use any products that contain nicotine or tobacco for at least 4 weeks before the procedure. These products include cigarettes, chewing tobacco, and vaping devices, such as e-cigarettes. If you need help quitting, ask your health care provider. What happens during the procedure? An IV will be inserted into one of your veins. You will be given one or more of the following: A medicine to help you relax (sedative). A medicine that is injected into your spine to numb the area below and slightly above the injection site (spinal anesthetic). A medicine that is injected into an area of your body to numb everything below the injection site (regional anesthetic). A medicine to make you fall asleep (general anesthetic). Your legs will be placed in foot rests (  stirrups) to open your legs and bend your knees. The resectoscope will be passed through your urethra and into your bladder. The part of your bladder with the tumor will be  resected by the cutting edge of the resectoscope. Fluid will be passed to rinse out the cut tissues (irrigation). The resectoscope will then be taken out. A small, thin tube (catheter) will be passed through your urethra and into your bladder. The catheter will drain urine into a bag outside of your body. The procedure may vary among health care providers and hospitals. What happens after the procedure? Your blood pressure, heart rate, breathing rate, and blood oxygen level will be monitored until you leave the hospital or clinic. You may continue to receive fluids and medicines through an IV. You will be given pain medicine to relieve pain. You will have a catheter to drain your urine. The amount of urine will be measured. If you have blood in your urine, your bladder may be rinsed out by passing fluid through your catheter. You will be encouraged to walk as soon as you can. You may have to wear compression stockings. These stockings help to prevent blood clots and reduce swelling in your legs. If you were given a sedative during the procedure, it can affect you for several hours. Do not drive or operate machinery until your health care provider says that it is safe. Summary Transurethral resection of a bladder tumor is the removal (resection) of a cancerous growth (tumor) on the inside wall of the bladder. To do this procedure, your health care provider uses a thin telescope with a light, a tiny camera, and an electric cutting edge (resectoscope) that is guided to your bladder through your urethra. The part of your bladder that is affected by the tumor will be resected by the cutting edge of the resectoscope. A catheter will be passed through your urethra and into your bladder. The catheter will drain urine into a bag outside of your body. If you will be going home right after the procedure, plan to have a responsible adult take you home from the hospital or clinic. You will not be allowed to  drive. This information is not intended to replace advice given to you by your health care provider. Make sure you discuss any questions you have with your health care provider. Document Revised: 01/24/2021 Document Reviewed: 01/24/2021 Elsevier Patient Education  2024 [employer].

## 2023-08-10 NOTE — H&P (View-Only) (Signed)
 08/10/23 11:12 AM   Leslie Love 02/11/1945 [id number]  CC: Bladder lesion, gross hematuria  HPI: 78 year old female who recently presented to the ER after a fall.  She also noted gross hematuria at that time.  Cross-sectional imaging was performed with CT, and showed a possible 1 cm mass at the base of the bladder.  She has had intermittent painless gross hematuria for the last year.  She also has had some problems with UTIs.   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   Cancer (HCC)    Left breast cancer 2014, lumpectomy   Chronic insomnia    Complication of anesthesia    GERD (gastroesophageal reflux disease)    History of gastritis    History of shingles    Hyperlipidemia    Hypertension    Nocturnal leg cramps    Osteopenia    Osteopenia    Palpitations    Personal history of radiation therapy    PONV (postoperative nausea and vomiting)    Vitamin B 12 deficiency    Vitamin D deficiency     Surgical History: Past Surgical History:  Procedure Laterality Date   ABDOMINAL HYSTERECTOMY     APPENDECTOMY     AUGMENTATION MAMMAPLASTY Bilateral 1980   removed due to bilateral implant rupture in 02/2016 left nipple removed   BREAST BIOPSY Left 2013   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   BREAST BIOPSY Left 09/16/2018   stero calcs x clip benign    BREAST BIOPSY Left 09/16/2018   stereo calcs ribbon clip benign    BREAST IMPLANT REMOVAL Bilateral 04/10/2016   Procedure: REMOVAL BILATERAL  BREAST IMPLANTS AND IMPLANT MATERIAL;  Surgeon: Earlis Ranks, MD;  Location: [facility];  Service: Plastics;  Laterality: Bilateral;   BREAST LUMPECTOMY Left 2013   clear margins   BREAST LUMPECTOMY W/ NEEDLE LOCALIZATION Left    BREAST SURGERY     CAPSULECTOMY Bilateral 04/10/2016   Procedure: CAPSULECTOMY;  Surgeon: Earlis Ranks, MD;  Location: [facility];  Service: Plastics;  Laterality: Bilateral;    COLONOSCOPY     COLONOSCOPY N/A 12/31/2014   Procedure: COLONOSCOPY;  Surgeon: Deward CINDERELLA Piedmont, MD;  Location: [facility] ENDOSCOPY;  Service: Gastroenterology;  Laterality: N/A;   COLONOSCOPY WITH PROPOFOL  N/A 02/04/2016   Procedure: COLONOSCOPY WITH PROPOFOL ;  Surgeon: Gladis RAYMOND Mariner, MD;  Location: [facility] ENDOSCOPY;  Service: Endoscopy;  Laterality: N/A;   ESOPHAGOGASTRODUODENOSCOPY     ESOPHAGOGASTRODUODENOSCOPY (EGD) WITH PROPOFOL  N/A 03/31/2021   Procedure: ESOPHAGOGASTRODUODENOSCOPY (EGD) WITH PROPOFOL ;  Surgeon: Maryruth Ole DASEN, MD;  Location: [facility] ENDOSCOPY;  Service: Endoscopy;  Laterality: N/A;   INCISION AND DRAINAGE OF WOUND Left 04/20/2016   Procedure: IRRIGATION AND DEBRIDEMENT OF LEFT BREAST;  Surgeon: Earlis Ranks, MD;  Location: [facility];  Service: Plastics;  Laterality: Left;   KNEE ARTHROSCOPY Left    partial medial and lateral menisceoctomy   LEFT HEART CATH AND CORONARY ANGIOGRAPHY N/A 08/19/2021   Procedure: LEFT HEART CATH AND CORONARY ANGIOGRAPHY;  Surgeon: Ammon Blunt, MD;  Location: [facility] INVASIVE CV LAB;  Service: Cardiovascular;  Laterality: N/A;   MASTOPEXY Bilateral 04/10/2016   Procedure: MASTOPEXY;  Surgeon: Earlis Ranks, MD;  Location: [facility];  Service: Plastics;  Laterality: Bilateral;    Family History: Family History  Problem Relation Age of Onset   Breast cancer Maternal Aunt 43   Breast cancer Paternal Grandmother 29  Breast cancer Maternal Aunt 41   Breast cancer Daughter 53    Social History:  reports that she has quit smoking. She has never used smokeless tobacco. She reports current alcohol use. She reports that she does not use drugs.  Physical Exam: BP 116/69 (BP Location: Left Arm, Patient Position: Sitting, Cuff Size: Normal)   Pulse 81   Ht 5' 3 (1.6 m)   Wt 171 lb (77.6 kg)   SpO2 94%   BMI 30.29 kg/m    Constitutional:  Alert and oriented, No acute distress. Cardiovascular: No clubbing,  cyanosis, or edema. Respiratory: Normal respiratory effort, no increased work of breathing. GI: Abdomen is soft, nontender, nondistended, no abdominal masses   Laboratory Data: Reviewed, see HPI  Pertinent Imaging: I have personally viewed and interpreted the CT scan from June 2025 showing a possible 1 cm right bladder base mass  Assessment & Plan:   78 year old female with intermittent gross hematuria likely secondary to a 1 cm bladder mass.  This likely represents small bladder tumor.  I offered clinic cystoscopy for confirmation versus proceeding directly to the OR for bladder biopsy and fulguration/TURBT.  She would like to proceed directly to the OR which I think is very reasonable based on her imaging and clinical history.  We discussed bladder biopsy and fulguration and risks and benefits at length. This is typically a 1 to 2-hour procedure done under general anesthesia in the operating room.  A scope is inserted through the urethra and used to resect abnormal tissue within the bladder, which is then sent to the pathologist to determine grade and stage of the tumor.  Risks include bleeding, infection, need for temporary Foley placement, and bladder perforation.  Treatment strategies are based on the type of tumor and depth of invasion.  We briefly reviewed the different treatment pathways for non-muscle invasive and muscle invasive bladder cancer.  Schedule cystoscopy, bladder biopsy, gemcitabine, bilateral retrograde pyelograms  Redell Burnet, MD 08/10/2023  [facility] [address] [place], [place] [address] [phone]

## 2023-08-10 NOTE — Progress Notes (Signed)
 08/10/23 11:12 AM   Rowena JINNY Lex 02/11/1945 [id number]  CC: Bladder lesion, gross hematuria  HPI: 78 year old female who recently presented to the ER after a fall.  She also noted gross hematuria at that time.  Cross-sectional imaging was performed with CT, and showed a possible 1 cm mass at the base of the bladder.  She has had intermittent painless gross hematuria for the last year.  She also has had some problems with UTIs.   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   Cancer (HCC)    Left breast cancer 2014, lumpectomy   Chronic insomnia    Complication of anesthesia    GERD (gastroesophageal reflux disease)    History of gastritis    History of shingles    Hyperlipidemia    Hypertension    Nocturnal leg cramps    Osteopenia    Osteopenia    Palpitations    Personal history of radiation therapy    PONV (postoperative nausea and vomiting)    Vitamin B 12 deficiency    Vitamin D deficiency     Surgical History: Past Surgical History:  Procedure Laterality Date   ABDOMINAL HYSTERECTOMY     APPENDECTOMY     AUGMENTATION MAMMAPLASTY Bilateral 1980   removed due to bilateral implant rupture in 02/2016 left nipple removed   BREAST BIOPSY Left 2013   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   BREAST BIOPSY Left 09/16/2018   stero calcs x clip benign    BREAST BIOPSY Left 09/16/2018   stereo calcs ribbon clip benign    BREAST IMPLANT REMOVAL Bilateral 04/10/2016   Procedure: REMOVAL BILATERAL  BREAST IMPLANTS AND IMPLANT MATERIAL;  Surgeon: Earlis Ranks, MD;  Location: [facility];  Service: Plastics;  Laterality: Bilateral;   BREAST LUMPECTOMY Left 2013   clear margins   BREAST LUMPECTOMY W/ NEEDLE LOCALIZATION Left    BREAST SURGERY     CAPSULECTOMY Bilateral 04/10/2016   Procedure: CAPSULECTOMY;  Surgeon: Earlis Ranks, MD;  Location: [facility];  Service: Plastics;  Laterality: Bilateral;    COLONOSCOPY     COLONOSCOPY N/A 12/31/2014   Procedure: COLONOSCOPY;  Surgeon: Deward CINDERELLA Piedmont, MD;  Location: [facility] ENDOSCOPY;  Service: Gastroenterology;  Laterality: N/A;   COLONOSCOPY WITH PROPOFOL  N/A 02/04/2016   Procedure: COLONOSCOPY WITH PROPOFOL ;  Surgeon: Gladis RAYMOND Mariner, MD;  Location: [facility] ENDOSCOPY;  Service: Endoscopy;  Laterality: N/A;   ESOPHAGOGASTRODUODENOSCOPY     ESOPHAGOGASTRODUODENOSCOPY (EGD) WITH PROPOFOL  N/A 03/31/2021   Procedure: ESOPHAGOGASTRODUODENOSCOPY (EGD) WITH PROPOFOL ;  Surgeon: Maryruth Ole DASEN, MD;  Location: [facility] ENDOSCOPY;  Service: Endoscopy;  Laterality: N/A;   INCISION AND DRAINAGE OF WOUND Left 04/20/2016   Procedure: IRRIGATION AND DEBRIDEMENT OF LEFT BREAST;  Surgeon: Earlis Ranks, MD;  Location: [facility];  Service: Plastics;  Laterality: Left;   KNEE ARTHROSCOPY Left    partial medial and lateral menisceoctomy   LEFT HEART CATH AND CORONARY ANGIOGRAPHY N/A 08/19/2021   Procedure: LEFT HEART CATH AND CORONARY ANGIOGRAPHY;  Surgeon: Ammon Blunt, MD;  Location: [facility] INVASIVE CV LAB;  Service: Cardiovascular;  Laterality: N/A;   MASTOPEXY Bilateral 04/10/2016   Procedure: MASTOPEXY;  Surgeon: Earlis Ranks, MD;  Location: [facility];  Service: Plastics;  Laterality: Bilateral;    Family History: Family History  Problem Relation Age of Onset   Breast cancer Maternal Aunt 43   Breast cancer Paternal Grandmother 29  Breast cancer Maternal Aunt 41   Breast cancer Daughter 53    Social History:  reports that she has quit smoking. She has never used smokeless tobacco. She reports current alcohol use. She reports that she does not use drugs.  Physical Exam: BP 116/69 (BP Location: Left Arm, Patient Position: Sitting, Cuff Size: Normal)   Pulse 81   Ht 5' 3 (1.6 m)   Wt 171 lb (77.6 kg)   SpO2 94%   BMI 30.29 kg/m    Constitutional:  Alert and oriented, No acute distress. Cardiovascular: No clubbing,  cyanosis, or edema. Respiratory: Normal respiratory effort, no increased work of breathing. GI: Abdomen is soft, nontender, nondistended, no abdominal masses   Laboratory Data: Reviewed, see HPI  Pertinent Imaging: I have personally viewed and interpreted the CT scan from June 2025 showing a possible 1 cm right bladder base mass  Assessment & Plan:   78 year old female with intermittent gross hematuria likely secondary to a 1 cm bladder mass.  This likely represents small bladder tumor.  I offered clinic cystoscopy for confirmation versus proceeding directly to the OR for bladder biopsy and fulguration/TURBT.  She would like to proceed directly to the OR which I think is very reasonable based on her imaging and clinical history.  We discussed bladder biopsy and fulguration and risks and benefits at length. This is typically a 1 to 2-hour procedure done under general anesthesia in the operating room.  A scope is inserted through the urethra and used to resect abnormal tissue within the bladder, which is then sent to the pathologist to determine grade and stage of the tumor.  Risks include bleeding, infection, need for temporary Foley placement, and bladder perforation.  Treatment strategies are based on the type of tumor and depth of invasion.  We briefly reviewed the different treatment pathways for non-muscle invasive and muscle invasive bladder cancer.  Schedule cystoscopy, bladder biopsy, gemcitabine, bilateral retrograde pyelograms  Redell Burnet, MD 08/10/2023  [facility] [address] [place], [place] [address] [phone]

## 2023-08-11 ENCOUNTER — Other Ambulatory Visit: Payer: Self-pay

## 2023-08-11 DIAGNOSIS — D494 Neoplasm of unspecified behavior of bladder: Secondary | ICD-10-CM

## 2023-08-11 NOTE — Progress Notes (Unsigned)
 Surgical Physician Order Form [facility]  Dr. Redell Burnet, MD  * Scheduling expectation : Next Available  *Length of Case: 30 minutes  *Clearance needed: no  *Anticoagulation Instructions: Hold all anticoagulants  *Aspirin  Instructions: Ok to continue 31 aspirin   *Post-op visit Date/Instructions:  1-2 week with pathology review  *Diagnosis: Bladder Tumor  *Procedure:  Cysto Bladder Biopsy (47795), bilateral retrograde pyelograms   Additional orders: Gemcitabine 2000mg  bladder instillation  -Admit type: OUTpatient  -Anesthesia: General  -VTE Prophylaxis Standing Order SCD's       Other:   -Standing Lab Orders Per Anesthesia    Lab other: UA&Urine Culture sent 7/8  -Standing Test orders EKG/Chest x-ray per Anesthesia       Test other:   - Medications:  Ancef  2gm IV  -Other orders:  N/A

## 2023-08-12 ENCOUNTER — Telehealth: Payer: Self-pay

## 2023-08-12 NOTE — Progress Notes (Signed)
   [facility] Surgical Posting Form  Surgery Date: Date: 08/20/2023  Surgeon: Dr. 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

## 2023-08-12 NOTE — Telephone Encounter (Signed)
 Per Dr. Francisca, Patient is to be scheduled for  Cystoscopy with Bladder Biopsy, Bilateral Retrograde Pyelograms and Intravesical Instillation of Gemcitabine   Leslie Love was contacted and possible surgical dates were discussed, Friday July 18th, 2025 was agreed upon for surgery.   Patient was directed to call [phone] between 1-3pm the day before surgery to find out surgical arrival time.  Instructions were given not to eat or drink from midnight on the night before surgery and have a driver for the day of surgery. On the surgery day patient was instructed to enter through the Medical Mall entrance of [facility] report the Same Day Surgery desk.   Pre-Admit Testing will be in contact via phone to set up an interview with the anesthesia team to review your history and medications prior to surgery.   Reminder of this information was sent via MyChart to the patient.

## 2023-08-13 LAB — CULTURE, URINE COMPREHENSIVE

## 2023-08-17 ENCOUNTER — Encounter
Admission: RE | Admit: 2023-08-17 | Discharge: 2023-08-17 | Disposition: A | Discharge status: Home / Self Care | Source: Ambulatory Visit | Attending: Urology | Admitting: Urology

## 2023-08-17 ENCOUNTER — Other Ambulatory Visit: Payer: Self-pay

## 2023-08-17 ENCOUNTER — Encounter: Payer: Self-pay | Admitting: Urology

## 2023-08-17 VITALS — Ht 63.0 in | Wt 170.0 lb

## 2023-08-17 DIAGNOSIS — E876 Hypokalemia: Secondary | ICD-10-CM

## 2023-08-17 DIAGNOSIS — E119 Type 2 diabetes mellitus without complications: Secondary | ICD-10-CM

## 2023-08-17 NOTE — Patient Instructions (Addendum)
 Your procedure is scheduled on: Friday 08/20/23 Report to the Registration Desk on the 1st floor of the Medical Mall. To find out your arrival time, please call [phone] between 1PM - 3PM on: Thursday 08/19/23 If your arrival time is 6:00 am, do not arrive before that time as the Medical Mall entrance doors do not open until 6:00 am.  REMEMBER: Instructions that are not followed completely may result in serious medical risk, up to and including death; or upon the discretion of your surgeon and anesthesiologist your surgery may need to be rescheduled.  Do not eat food or drink fluids after midnight the night before surgery.  No gum chewing or hard candies.   One week prior to surgery: Stop Anti-inflammatories (NSAIDS) such as Advil, Aleve, Ibuprofen, Motrin, Naproxen, Naprosyn and Aspirin  based products such as Excedrin, Goody's Powder, BC Powder. Stop ANY OVER THE COUNTER supplements until after surgery. TURMERIC PO  Multiple Vitamins-Minerals (ZINC PO)  magnesium oxide (MAG-OX) 400 (240 Mg) MG  ELDERBERRY PO  Cyanocobalamin (VITAMIN B-12 PO)  Cholecalciferol (VITAMIN D3 PO)  Calcium Carbonate-Vitamin D (CALCIUM + D PO)   You may however, continue to take Tylenol  if needed for pain up until the day of surgery.  Stop aspirin  325 MG today, as instructed by your surgeon.  Stop metFORMIN (GLUCOPHAGE) 1000 MG 2 days prior to surgery (Take last dose 08/17/23)  Continue taking all of your other prescription medications up until the day of surgery.  ON THE DAY OF SURGERY ONLY TAKE THESE MEDICATIONS WITH SIPS OF WATER:  DULoxetine (CYMBALTA) 30 MG capsule  omeprazole (PRILOSEC) 20 MG    No Alcohol for 24 hours before or after surgery.  No Smoking including e-cigarettes for 24 hours before surgery.  No chewable tobacco products for at least 6 hours before surgery.  No nicotine patches on the day of surgery.  Do not use any recreational drugs for at least a week (preferably 2  weeks) before your surgery.  Please be advised that the combination of cocaine and anesthesia may have negative outcomes, up to and including death. If you test positive for cocaine, your surgery will be cancelled.  On the morning of surgery brush your teeth with toothpaste and water, you may rinse your mouth with mouthwash if you wish. Do not swallow any toothpaste or mouthwash.  Take a fresh shower the morning of your procedure.   Do not wear jewelry, make-up, hairpins, clips or nail polish.  For welded (permanent) jewelry: bracelets, anklets, waist bands, etc.  Please have this removed prior to surgery.  If it is not removed, there is a chance that hospital personnel will need to cut it off on the day of surgery.  Do not wear lotions, powders, or perfumes.   Do not shave body hair from the neck down 48 hours before surgery.  Contact lenses, hearing aids and dentures may not be worn into surgery.  Do not bring valuables to the hospital. [facility] is not responsible for any missing/lost belongings or valuables.    Notify your doctor if there is any change in your medical condition (cold, fever, infection).  Wear comfortable clothing (specific to your surgery type) to the hospital.  After surgery, you can help prevent lung complications by doing breathing exercises.  Take deep breaths and cough every 1-2 hours. Your doctor may order a device called an Incentive Spirometer to help you take deep breaths. When coughing or sneezing, hold a pillow firmly against your incision with both  hands. This is called "splinting." Doing this helps protect your incision. It also decreases belly discomfort.  If you are being admitted to the hospital overnight, leave your suitcase in the car. After surgery it may be brought to your room.  In case of increased patient census, it may be necessary for you, the patient, to continue your postoperative care in the Same Day Surgery department.  If you are  being discharged the day of surgery, you will not be allowed to drive home. You will need a responsible individual to drive you home and stay with you for 24 hours after surgery.   If you are taking public transportation, you will need to have a responsible individual with you.  Please call the Pre-admissions Testing Dept. at [phone] if you have any questions about these instructions.  Surgery Visitation Policy:  Patients having surgery or a procedure may have two visitors.  Children under the age of 11 must have an adult with them who is not the patient.  Inpatient Visitation:    Visiting hours are 7 a.m. to 8 p.m. Up to four visitors are allowed at one time in a patient room. The visitors may rotate out with other people during the day.  One visitor age 15 or older may stay with the patient overnight and must be in the room by 8 p.m.   Merchandiser, retail to address health-related social needs:  [email or web address]

## 2023-08-18 ENCOUNTER — Encounter
Admission: RE | Admit: 2023-08-18 | Discharge: 2023-08-18 | Disposition: A | Discharge status: Home / Self Care | Source: Ambulatory Visit | Attending: Urology | Admitting: Urology

## 2023-08-18 ENCOUNTER — Encounter: Payer: Self-pay | Admitting: Urology

## 2023-08-18 DIAGNOSIS — Z01812 Encounter for preprocedural laboratory examination: Secondary | ICD-10-CM | POA: Insufficient documentation

## 2023-08-18 DIAGNOSIS — E119 Type 2 diabetes mellitus without complications: Secondary | ICD-10-CM | POA: Insufficient documentation

## 2023-08-18 DIAGNOSIS — Z01818 Encounter for other preprocedural examination: Secondary | ICD-10-CM | POA: Diagnosis present

## 2023-08-18 DIAGNOSIS — E876 Hypokalemia: Secondary | ICD-10-CM | POA: Insufficient documentation

## 2023-08-18 LAB — BASIC METABOLIC PANEL WITH GFR
Anion gap: 10 (ref 5–15)
BUN: 8 mg/dL (ref 8–23)
CO2: 29 mmol/L (ref 22–32)
Calcium: 9.1 mg/dL (ref 8.9–10.3)
Chloride: 98 mmol/L (ref 98–111)
Creatinine, Ser: 0.7 mg/dL (ref 0.44–1.00)
GFR, Estimated: >60 mL/min (ref >60)
Glucose, Bld: 142 mg/dL — ABNORMAL HIGH (ref 70–99)
Potassium: 3.6 mmol/L (ref 3.5–5.1)
Sodium: 137 mmol/L (ref 135–145)

## 2023-08-18 NOTE — Progress Notes (Signed)
 Perioperative / Anesthesia Services  Pre-Admission Testing Clinical Review / Pre-Operative Anesthesia Consult  Date: 08/18/23  PATIENT DEMOGRAPHICS: Name: Leslie Love DOB: 31-Aug-1945 MRN:   [id number]  Note: Available PAT nursing documentation and vital signs have been reviewed. Clinical nursing staff has updated patient's PMH/PSHx, current medication list, and drug allergies/intolerances to ensure complete and comprehensive history available to assist care teams in MDM as it pertains to the aforementioned surgical procedure and anticipated anesthetic course. Extensive review of available clinical information personally performed. Leslie Love PMH and PSHx updated with any diagnoses/procedures that  may have been inadvertently omitted during his intake with the pre-admission testing department's nursing staff.  PLANNED SURGICAL PROCEDURE(S):   Case: [id number] Date/Time: 08/20/23 1405   Procedures:      CYSTOSCOPY, WITH BIOPSY - Bladder     CYSTOSCOPY, WITH RETROGRADE PYELOGRAM (Bilateral)     INSTILLATION, BLADDER - GEMCITABINE    Anesthesia type: General   Diagnosis: Bladder tumor [D49.4]   Pre-op diagnosis: Bladder Tumor   Location: [facility] OR ROOM 10 / [facility] FOR ANEST[facility]   Surgeons: Francisca Redell BROCKS, MD        CLINICAL DISCUSSION: SHELLYE Love is a 78 y.o. female who is submitted for pre-surgical anesthesia review and clearance prior to her undergoing the above procedure. Patient is a Former Games developer. Pertinent PMH includes: CAD, incomplete RBBB, palpitations, aortic atherosclerosis, HTN, HLD, T2DM, GERD (on daily PPI), anemia, remote LEFT breast cancer (DCIS), bladder tumor, OA, lumbar DDD, anxiety, insomnia.  Patient is followed by cardiology Andrea, MD). She was last seen in the cardiology clinic on 06/01/2023; notes reviewed. At the time of her clinic visit, patient doing well overall from a cardiovascular perspective. Patient denied any chest pain, shortness of  breath, PND, orthopnea, palpitations, significant peripheral edema, weakness, fatigue, vertiginous symptoms, or presyncope/syncope. Patient with a past medical history significant for cardiovascular diagnoses. Documented physical exam was grossly benign, providing no evidence of acute exacerbation and/or decompensation of the patient's known cardiovascular conditions.  Stress echocardiogram was performed on 07/16/2021.  Patient was stressed for a total of 2 minutes and 49 seconds achieving a maximum heart rate of 150 bpm; MPHR 142 bpm.  There was anterior basal, lateral basal, and lateral mid basal wall hypokinesis.  Right ventricular size and function normal.  There was trivial to mild paravalvular regurgitation.  All transvalvular gradients were noted to be normal providing no evidence suggestive of valvular stenosis.  Aorta normal in size with no evidence of ectasia or aneurysmal dilatation.  Patient underwent diagnostic LEFT heart catheterization on 08/19/2021 revealing multivessel CAD; 20% proximal RCA, 30% mid RCA, 70% ramus intermedius, 20% proximal LCx, and 20% LAD.  Interventional cardiology made the decision to defer intervention opting for medical therapy and aggressive risk factor modification.  Blood pressure well controlled at 118/66 mmHg on currently prescribed beta-blocker-diuretic (bisoprolol  + HCTZ) therapies.  Patient is on rosuvastatin for her HLD diagnosis and ASCVD prevention. T2DM well controlled on currently prescribed regimen; last HgbA1c was 6.3% when checked on 05/07/2023.  Patient does not have an OSAH diagnosis.  Patient is active per baseline.  She walks frequently.  She is able to complete all of her ADLs/IADLs without cardiovascular limitation.  Per the DASI, patient is able to exceed 4 METS of physical activity without experiencing any significant degrees of angina/anginal equivalent symptoms. No changes were made to her medication regimen during her visit with cardiology.   Patient scheduled to follow-up with outpatient cardiology in 6 months or  sooner if needed.  Leslie Love is scheduled for an elective CYSTOSCOPY, WITH BIOPSY; CYSTOSCOPY WITH RETROGRADE PYELOGRAM (Bilateral); INTRAVESICAL GEMCITABINE  INSTILLATION on 08/20/2023 with Dr. Redell Burnet, MD. Given patient's past medical history significant for cardiovascular diagnoses, presurgical cardiac clearance was sought by the PAT team. Per cardiology, this patient is optimized for surgery and may proceed with the planned procedural course with a ACCEPTABLE risk of significant perioperative cardiovascular complications.  In review of the patient's chart, it is noted that she is on daily oral antithrombotic therapy. She has been instructed on recommendations for holding her ASA 325 mg dose for 2 days prior to her procedure with plans to restart as soon as postoperative bleeding risk felt to be minimized by his primary attending surgeon. The patient has been instructed that her last dose of should be on 08/17/2023.  Patient reports previous perioperative complications with anesthesia in the past. Patient has a PMH (+) for PONV. Symptoms and history of PONV will be discussed with patient by anesthesia team on the day of her procedure. Interventions will be ordered as deemed necessary based on patient's individual care needs as determined by anesthesiologist. In review her EMR, it is noted that patient underwent a general anesthetic course here at [facility] (ASA III) in 03/2021 without documented complications.   MOST RECENT VITAL SIGNS:    08/17/2023   10:57 AM 08/10/2023   10:33 AM 07/15/2023    8:15 PM  Vitals with BMI  Height 5' 3 5' 3   Weight 170 lbs 171 lbs   BMI 30.12 30.3   Systolic  116   Diastolic  69   Pulse  81 74   PROVIDERS/SPECIALISTS: NOTE: Primary physician provider listed below. Patient may have been seen by APP or partner within same practice.    PROVIDER ROLE / SPECIALTY LAST SHERLEAN Burnet Redell JAYSON, MD Urology (Surgeon) 08/10/2023  Auston Reyes BIRCH, MD Primary Care Provider 05/14/2023  Ammon Blunt, MD Cardiology 06/01/2023   ALLERGIES: Allergies  Allergen Reactions   Other Anaphylaxis    Fire ants   Codeine Nausea And Vomiting    CURRENT HOME MEDICATIONS: No current facility-administered medications for this encounter.    ALPRAZolam  (XANAX ) 0.25 MG tablet   aspirin  325 MG tablet   bisoprolol -hydrochlorothiazide  (ZIAC ) 5-6.25 MG per tablet   Calcium Carbonate-Vitamin D (CALCIUM + D PO)   Cholecalciferol (VITAMIN D3 PO)   clotrimazole-betamethasone (LOTRISONE) cream   Cyanocobalamin (VITAMIN B-12 PO)   docusate sodium (COLACE) 100 MG capsule   DULoxetine (CYMBALTA) 30 MG capsule   ELDERBERRY PO   EPINEPHrine 0.3 mg/0.3 mL IJ SOAJ injection   imipramine (TOFRANIL) 25 MG tablet   magnesium oxide (MAG-OX) 400 (240 Mg) MG tablet   metFORMIN (GLUCOPHAGE) 1000 MG tablet   Multiple Vitamins-Minerals (ZINC PO)   omeprazole (PRILOSEC) 20 MG capsule   rosuvastatin (CRESTOR) 10 MG tablet   TURMERIC PO   HISTORY: Past Medical History:  Diagnosis Date   Anemia    Anxiety    a.) uses BZO (alprazolam ) PRN   Aortic atherosclerosis (HCC)    Bladder tumor    CAD (coronary artery disease)    Chronic insomnia    Complication of anesthesia    DDD (degenerative disc disease), lumbar    a.) disc desiccation and vacuum phenomen   Diverticulosis    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   a.) s/p lumpectomy 12/09/2021 - pathology showed nuclear grade II DCIS (cribriform  subtype with calcifications)   GERD (gastroesophageal reflux disease)    History of gastritis    History of shingles    Hyperlipidemia    Hypertension    Incomplete RBBB    Long-term use of aspirin  therapy (325 mg)    Motion sickness    Nocturnal leg cramps    Osteoarthritis    Osteopenia    Palpitations    Personal history  of radiation therapy    PONV (postoperative nausea and vomiting)    T2DM (type 2 diabetes mellitus) (HCC)    Vitamin B 12 deficiency    Vitamin D deficiency    Past Surgical History:  Procedure Laterality Date   ABDOMINAL HYSTERECTOMY     APPENDECTOMY     AUGMENTATION MAMMAPLASTY Bilateral 1980   removed due to bilateral implant rupture in 02/2016 left nipple removed   BREAST BIOPSY Left 2013   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   BREAST BIOPSY Left 09/16/2018   stero calcs x clip benign    BREAST BIOPSY Left 09/16/2018   stereo calcs ribbon clip benign    BREAST IMPLANT REMOVAL Bilateral 04/10/2016   Procedure: REMOVAL BILATERAL  BREAST IMPLANTS AND IMPLANT MATERIAL;  Surgeon: Earlis Ranks, MD;  Location: [facility];  Service: Plastics;  Laterality: Bilateral;   BREAST LUMPECTOMY Left 2013   clear margins   BREAST LUMPECTOMY W/ NEEDLE LOCALIZATION Left    BREAST SURGERY     CAPSULECTOMY Bilateral 04/10/2016   Procedure: CAPSULECTOMY;  Surgeon: Earlis Ranks, MD;  Location: [facility];  Service: Plastics;  Laterality: Bilateral;   COLONOSCOPY     COLONOSCOPY N/A 12/31/2014   Procedure: COLONOSCOPY;  Surgeon: Deward CINDERELLA Piedmont, MD;  Location: [facility] ENDOSCOPY;  Service: Gastroenterology;  Laterality: N/A;   COLONOSCOPY WITH PROPOFOL  N/A 02/04/2016   Procedure: COLONOSCOPY WITH PROPOFOL ;  Surgeon: Gladis RAYMOND Mariner, MD;  Location: [facility] ENDOSCOPY;  Service: Endoscopy;  Laterality: N/A;   ESOPHAGOGASTRODUODENOSCOPY     ESOPHAGOGASTRODUODENOSCOPY (EGD) WITH PROPOFOL  N/A 03/31/2021   Procedure: ESOPHAGOGASTRODUODENOSCOPY (EGD) WITH PROPOFOL ;  Surgeon: Maryruth Ole DASEN, MD;  Location: [facility] ENDOSCOPY;  Service: Endoscopy;  Laterality: N/A;   INCISION AND DRAINAGE OF WOUND Left 04/20/2016   Procedure: IRRIGATION AND DEBRIDEMENT OF LEFT BREAST;  Surgeon: Earlis Ranks, MD;  Location: [facility];  Service: Plastics;  Laterality: Left;   KNEE ARTHROSCOPY Left     partial medial and lateral menisceoctomy   LEFT HEART CATH AND CORONARY ANGIOGRAPHY N/A 08/19/2021   Procedure: LEFT HEART CATH AND CORONARY ANGIOGRAPHY;  Surgeon: Ammon Blunt, MD;  Location: [facility] INVASIVE CV [facility];  Service: Cardiovascular;  Laterality: N/A;   MASTOPEXY Bilateral 04/10/2016   Procedure: MASTOPEXY;  Surgeon: Earlis Ranks, MD;  Location: [facility];  Service: Plastics;  Laterality: Bilateral;   Family History  Problem Relation Age of Onset   Breast cancer Maternal Aunt 3   Breast cancer Paternal Grandmother 65   Breast cancer Maternal Aunt 46   Breast cancer Daughter 61   Social History   Tobacco Use   Smoking status: Former   Smokeless tobacco: Never  Substance Use Topics   Alcohol use: Yes    Alcohol/week: 1.0 standard drink of alcohol    Types: 1 Glasses of wine per week    Comment: social   LABS:  Hospital Outpatient Visit on 08/18/2023  Component Date Value Ref Range Status   Sodium 08/18/2023 137  135 - 145 mmol/L Final   Potassium 08/18/2023 3.6  3.5 -  5.1 mmol/L Final   Chloride 08/18/2023 98  98 - 111 mmol/L Final   CO2 08/18/2023 29  22 - 32 mmol/L Final   Glucose, Bld 08/18/2023 142 (H)  70 - 99 mg/dL Final   Glucose reference range applies only to samples taken after fasting for at least 8 hours.   BUN 08/18/2023 8  8 - 23 mg/dL Final   Creatinine, Ser 08/18/2023 0.70  0.44 - 1.00 mg/dL Final   Calcium 92/83/7974 9.1  8.9 - 10.3 mg/dL Final   GFR, Estimated 08/18/2023 >60  >60 mL/min Final   Comment: (NOTE) Calculated using the CKD-EPI Creatinine Equation (2021)    Anion gap 08/18/2023 10  5 - 15 Final   Performed at [facility], [address] [place], [place] [address]  Office Visit on 08/10/2023  Component Date Value Ref Range Status   Specific Gravity, UA 08/10/2023 1.020  1.005 - 1.030 Final   pH, UA 08/10/2023 6.0  5.0 - 7.5 Final   Color, UA 08/10/2023 Yellow  Yellow Final   Appearance Ur  08/10/2023 Clear  Clear Final   Leukocytes,UA 08/10/2023 Negative  Negative Final   Protein,UA 08/10/2023 Negative  Negative/Trace Final   Glucose, UA 08/10/2023 Negative  Negative Final   Ketones, UA 08/10/2023 Trace (A)  Negative Final   RBC, UA 08/10/2023 Negative  Negative Final   Bilirubin, UA 08/10/2023 Negative  Negative Final   Urobilinogen, Ur 08/10/2023 0.2  0.2 - 1.0 mg/dL Final   Nitrite, UA 92/91/7974 Negative  Negative Final   Microscopic Examination 08/10/2023 Comment   Final   Microscopic follows if indicated.   Microscopic Examination 08/10/2023 See below:   Final   Microscopic was indicated and was performed.   Urine Culture, Comprehensive 08/10/2023 Final report   Final   Organism ID, Bacteria 08/10/2023 Comment   Final   No growth in 36 - 48 hours.   WBC, UA 08/10/2023 0-5  0 - 5 /hpf Final   RBC, Urine 08/10/2023 0-2  0 - 2 /hpf Final   Epithelial Cells (non renal) 08/10/2023 0-10  0 - 10 /hpf Final   Bacteria, UA 08/10/2023 Few  None seen/Few Final    ECG: Date: 07/15/2023 Time ECG obtained: 1834 PM Rate: 71 bpm Rhythm: Normal sinus rhythm Axis (leads I and aVF): normal Intervals: QRS 113 ms. QTc 476 ms. ST segment and T wave changes: No evidence of acute T wave abnormalities or significant ST segment elevation or depression.  Evidence of a possible, age undetermined, prior infarct:  No Comparison: Previous tracing obtained on 08/19/2021 showed sinus rhythm with abnormal R wave progression at a rate of 65 bpm.   IMAGING / PROCEDURES: LEFT HEART CATHETERIZATION AND CORONARY ANGIOGRAPHY performed on 08/19/2021 Normal left ventricular systolic function with a EF of 55 to 65% Multivessel CAD Prox RCA lesion is 20% stenosed. Mid RCA lesion is 30% stenosed. Ramus lesion is 70% stenosed. Prox Cx lesion is 20% stenosed. Mid LAD lesion is 20% stenosed. Recommendations: medical therapy and aggressive risk factor modification   CT CERVICAL SPINE WO CONTRAST  performed on 07/15/2023 No CT evidence for acute intracranial abnormality. Mild atrophy. Reversal of cervical lordosis with multilevel degenerative changes. No acute osseous abnormality.  ECHO STRESS TEST performed on 07/16/2021 Stress duration 2 minutes and 49 seconds Maximum stress heart rate of 150 bpm Anterior basal, lateral basal, and lateral mid basal wall hypokinesis Normal right ventricular size and function Trivial MR, TR, and PR Mild MR Normal gradients;  no valvular stenosis  IMPRESSION AND PLAN: BRITTON PERKINSON has been referred for pre-anesthesia review and clearance prior to her undergoing the planned anesthetic and procedural courses. Available labs, pertinent testing, and imaging results were personally reviewed by me in preparation for upcoming operative/procedural course. [facility] medical record has been updated following extensive record review and patient interview with PAT staff.   This patient has been appropriately cleared by cardiology with an overall ACCEPTABLE  risk of patient experiencing significant perioperative cardiovascular complications. Based on clinical review performed today (08/18/23), barring any significant acute changes in the patient's overall condition, it is anticipated that she will be able to proceed with the planned surgical intervention. Any acute changes in clinical condition may necessitate her procedure being postponed and/or cancelled. Patient will meet with anesthesia team (MD and/or CRNA) on the day of her procedure for preoperative evaluation/assessment. Questions regarding anesthetic course will be fielded at that time.   Pre-surgical instructions were reviewed with the patient during his PAT appointment, and questions were fielded to satisfaction by PAT clinical staff. She has been instructed on which medications that she will need to hold prior to surgery, as well as the ones that have been deemed safe/appropriate to take on the day of her  procedure. As part of the general education provided by PAT, patient made aware both verbally and in writing, that she would need to abstain from the use of any illegal substances during her perioperative course. She was advised that failure to follow the provided instructions could necessitate case cancellation or result in serious perioperative complications up to and including death. Patient encouraged to contact PAT and/or her surgeon's office to discuss any questions or concerns that may arise prior to surgery; verbalized understanding.   Dorise Pereyra, MSN, APRN, FNP-C, CEN [facility]  Perioperative Services Nurse Practitioner Phone: [phone] Fax: [phone] 08/18/23 3:12 PM  NOTE: This note has been prepared using Dragon dictation software. Despite my best ability to proofread, there is always the potential that unintentional transcriptional errors may still occur from this process .

## 2023-08-19 MED ORDER — GEMCITABINE CHEMO FOR BLADDER INSTILLATION 2000 MG
2000.0000 mg | Freq: Once | INTRAVENOUS | Status: DC
  Filled 2023-08-19: qty 52.6

## 2023-08-19 MED ORDER — SODIUM CHLORIDE 0.9 % IV SOLN: INTRAVENOUS | Status: DC

## 2023-08-19 MED ORDER — CHLORHEXIDINE GLUCONATE 0.12 % MT SOLN
15.0000 mL | Freq: Once | OROMUCOSAL | Status: AC
Start: 2023-08-19 — End: 2023-08-20
  Administered 2023-08-20: 15 mL via OROMUCOSAL

## 2023-08-19 MED ORDER — ORAL CARE MOUTH RINSE: 15.0000 mL | Freq: Once | OROMUCOSAL | Status: AC

## 2023-08-19 MED ORDER — CEFAZOLIN SODIUM-DEXTROSE 2-4 GM/100ML-% IV SOLN
2.0000 g | INTRAVENOUS | Status: AC
  Administered 2023-08-20: 2 g via INTRAVENOUS

## 2023-08-20 ENCOUNTER — Encounter: Payer: Self-pay | Admitting: Advanced Practice Midwife

## 2023-08-20 ENCOUNTER — Encounter: Payer: Self-pay | Admitting: Urology

## 2023-08-20 ENCOUNTER — Ambulatory Visit: Admitting: Urgent Care

## 2023-08-20 ENCOUNTER — Ambulatory Visit

## 2023-08-20 ENCOUNTER — Other Ambulatory Visit: Payer: Self-pay

## 2023-08-20 ENCOUNTER — Encounter
Admission: RE | Disposition: A | Discharge status: Home / Self Care | Payer: Self-pay | Source: Ambulatory Visit | Attending: Urology

## 2023-08-20 ENCOUNTER — Ambulatory Visit
Admission: RE | Admit: 2023-08-20 | Discharge: 2023-08-20 | Disposition: A | Discharge status: Home / Self Care | Source: Ambulatory Visit | Attending: Urology | Admitting: Urology

## 2023-08-20 DIAGNOSIS — R310 Gross hematuria: Secondary | ICD-10-CM | POA: Diagnosis not present

## 2023-08-20 DIAGNOSIS — Z7984 Long term (current) use of oral hypoglycemic drugs: Secondary | ICD-10-CM | POA: Insufficient documentation

## 2023-08-20 DIAGNOSIS — I2510 Atherosclerotic heart disease of native coronary artery without angina pectoris: Secondary | ICD-10-CM | POA: Diagnosis not present

## 2023-08-20 DIAGNOSIS — E785 Hyperlipidemia, unspecified: Secondary | ICD-10-CM | POA: Diagnosis not present

## 2023-08-20 DIAGNOSIS — Z79899 Other long term (current) drug therapy: Secondary | ICD-10-CM | POA: Diagnosis not present

## 2023-08-20 DIAGNOSIS — E119 Type 2 diabetes mellitus without complications: Secondary | ICD-10-CM | POA: Diagnosis not present

## 2023-08-20 DIAGNOSIS — I10 Essential (primary) hypertension: Secondary | ICD-10-CM | POA: Diagnosis not present

## 2023-08-20 DIAGNOSIS — Z853 Personal history of malignant neoplasm of breast: Secondary | ICD-10-CM | POA: Insufficient documentation

## 2023-08-20 DIAGNOSIS — D494 Neoplasm of unspecified behavior of bladder: Secondary | ICD-10-CM

## 2023-08-20 DIAGNOSIS — K219 Gastro-esophageal reflux disease without esophagitis: Secondary | ICD-10-CM | POA: Diagnosis not present

## 2023-08-20 DIAGNOSIS — Z87891 Personal history of nicotine dependence: Secondary | ICD-10-CM | POA: Diagnosis not present

## 2023-08-20 DIAGNOSIS — C672 Malignant neoplasm of lateral wall of bladder: Secondary | ICD-10-CM | POA: Insufficient documentation

## 2023-08-20 DIAGNOSIS — F419 Anxiety disorder, unspecified: Secondary | ICD-10-CM | POA: Insufficient documentation

## 2023-08-20 DIAGNOSIS — I4510 Unspecified right bundle-branch block: Secondary | ICD-10-CM | POA: Insufficient documentation

## 2023-08-20 HISTORY — DX: Diverticulosis of intestine, part unspecified, without perforation or abscess without bleeding: K57.90

## 2023-08-20 HISTORY — DX: Unspecified right bundle-branch block: I45.10

## 2023-08-20 HISTORY — DX: Atherosclerotic heart disease of native coronary artery without angina pectoris: I25.10

## 2023-08-20 HISTORY — DX: Neoplasm of unspecified behavior of bladder: D49.4

## 2023-08-20 HISTORY — DX: Other intervertebral disc degeneration, lumbar region without mention of lumbar back pain or lower extremity pain: M51.369

## 2023-08-20 HISTORY — DX: Unspecified osteoarthritis, unspecified site: M19.90

## 2023-08-20 HISTORY — DX: Type 2 diabetes mellitus without complications: E11.9

## 2023-08-20 HISTORY — DX: Motion sickness, initial encounter: T75.3XXA

## 2023-08-20 HISTORY — DX: Anxiety disorder, unspecified: F41.9

## 2023-08-20 HISTORY — DX: Atherosclerosis of aorta: I70.0

## 2023-08-20 HISTORY — DX: Long term (current) use of aspirin: Z79.82

## 2023-08-20 LAB — GLUCOSE, CAPILLARY
Glucose-Capillary: 147 mg/dL — ABNORMAL HIGH (ref 70–99)
Glucose-Capillary: 146 mg/dL — ABNORMAL HIGH (ref 70–99)

## 2023-08-20 SURGERY — CYSTOSCOPY, WITH BIOPSY
Anesthesia: General

## 2023-08-20 MED ORDER — FENTANYL CITRATE (PF) 100 MCG/2ML IJ SOLN
INTRAMUSCULAR | Status: AC
Start: 2023-08-20 — End: 2023-08-20
  Filled 2023-08-20: qty 2

## 2023-08-20 MED ORDER — DEXAMETHASONE SODIUM PHOSPHATE 10 MG/ML IJ SOLN
INTRAMUSCULAR | Status: DC | PRN
  Administered 2023-08-20: 5 mg via INTRAVENOUS

## 2023-08-20 MED ORDER — GEMCITABINE CHEMO FOR BLADDER INSTILLATION 2000 MG
INTRAVENOUS | Status: DC | PRN
  Administered 2023-08-20: 2000 mg via INTRAVESICAL

## 2023-08-20 MED ORDER — CEFAZOLIN SODIUM-DEXTROSE 2-4 GM/100ML-% IV SOLN
INTRAVENOUS | Status: AC
  Filled 2023-08-20: qty 100

## 2023-08-20 MED ORDER — OXYCODONE HCL 5 MG/5ML PO SOLN: 5.0000 mg | Freq: Once | ORAL | Status: DC | PRN

## 2023-08-20 MED ORDER — CHLORHEXIDINE GLUCONATE 0.12 % MT SOLN
OROMUCOSAL | Status: AC
  Filled 2023-08-20: qty 15

## 2023-08-20 MED ORDER — FENTANYL CITRATE (PF) 100 MCG/2ML IJ SOLN: 25.0000 ug | INTRAMUSCULAR | Status: DC | PRN

## 2023-08-20 MED ORDER — ONDANSETRON HCL 4 MG/2ML IJ SOLN: 4.0000 mg | Freq: Once | INTRAMUSCULAR | Status: DC | PRN

## 2023-08-20 MED ORDER — FENTANYL CITRATE (PF) 100 MCG/2ML IJ SOLN
INTRAMUSCULAR | Status: DC | PRN
  Administered 2023-08-20 (×2): 50 ug via INTRAVENOUS

## 2023-08-20 MED ORDER — PROPOFOL 1000 MG/100ML IV EMUL
INTRAVENOUS | Status: AC
Start: 2023-08-20 — End: 2023-08-20
  Filled 2023-08-20: qty 100

## 2023-08-20 MED ORDER — STERILE WATER FOR IRRIGATION IR SOLN
Status: DC | PRN
  Administered 2023-08-20: 500 mL
  Administered 2023-08-20: 3000 mL

## 2023-08-20 MED ORDER — DEXMEDETOMIDINE HCL IN NACL 200 MCG/50ML IV SOLN
INTRAVENOUS | Status: DC | PRN
Start: 2023-08-20 — End: 2023-08-20
  Administered 2023-08-20: 12 ug via INTRAVENOUS

## 2023-08-20 MED ORDER — ROCURONIUM BROMIDE 10 MG/ML (PF) SYRINGE
PREFILLED_SYRINGE | INTRAVENOUS | Status: DC | PRN
  Administered 2023-08-20: 50 mg via INTRAVENOUS

## 2023-08-20 MED ORDER — IOHEXOL 180 MG/ML  SOLN
INTRAMUSCULAR | Status: DC | PRN
  Administered 2023-08-20: 20 mL

## 2023-08-20 MED ORDER — ONDANSETRON HCL 4 MG/2ML IJ SOLN
INTRAMUSCULAR | Status: DC | PRN
Start: 2023-08-20 — End: 2023-08-20
  Administered 2023-08-20 (×2): 4 mg via INTRAVENOUS

## 2023-08-20 MED ORDER — PROPOFOL 10 MG/ML IV BOLUS
INTRAVENOUS | Status: DC | PRN
  Administered 2023-08-20: 110 mg via INTRAVENOUS
  Administered 2023-08-20: 125 ug/kg/min via INTRAVENOUS

## 2023-08-20 MED ORDER — SUGAMMADEX SODIUM 200 MG/2ML IV SOLN
INTRAVENOUS | Status: DC | PRN
Start: 2023-08-20 — End: 2023-08-20
  Administered 2023-08-20: 200 mg via INTRAVENOUS

## 2023-08-20 MED ORDER — LIDOCAINE HCL (CARDIAC) PF 100 MG/5ML IV SOSY
PREFILLED_SYRINGE | INTRAVENOUS | Status: DC | PRN
  Administered 2023-08-20: 80 mg via INTRAVENOUS

## 2023-08-20 MED ORDER — OXYCODONE HCL 5 MG PO TABS: 5.0000 mg | ORAL_TABLET | Freq: Once | ORAL | Status: DC | PRN

## 2023-08-20 MED ORDER — ACETAMINOPHEN 10 MG/ML IV SOLN
INTRAVENOUS | Status: DC | PRN
  Administered 2023-08-20: 1000 mg via INTRAVENOUS

## 2023-08-20 SURGICAL SUPPLY — 16 items
BAG DRAIN SIEMENS DORNER NS (MISCELLANEOUS) ×3 IMPLANT
BRUSH SCRUB EZ 4% CHG (MISCELLANEOUS) ×3 IMPLANT
CATH FOLEY 2WAY 18X30 (CATHETERS) ×3 IMPLANT
CATH URETL OPEN 5X70 (CATHETERS) ×3 IMPLANT
DRAPE UTILITY 15X26 TOWEL STRL (DRAPES) ×3 IMPLANT
GLOVE BIOGEL PI IND STRL 7.5 (GLOVE) ×3 IMPLANT
GOWN STRL REUS W/ TWL LRG LVL3 (GOWN DISPOSABLE) ×3 IMPLANT
GOWN STRL REUS W/ TWL XL LVL3 (GOWN DISPOSABLE) ×3 IMPLANT
GUIDEWIRE STR DUAL SENSOR (WIRE) ×3 IMPLANT
KIT TURNOVER CYSTO (KITS) ×3 IMPLANT
PACK CYSTO AR (MISCELLANEOUS) ×3 IMPLANT
SET CYSTO W/LG BORE CLAMP LF (SET/KITS/TRAYS/PACK) ×3 IMPLANT
SOL .9 NS 3000ML IRR UROMATIC (IV SOLUTION) ×3 IMPLANT
SURGILUBE 2OZ TUBE FLIPTOP (MISCELLANEOUS) ×3 IMPLANT
WATER STERILE IRR 3000ML UROMA (IV SOLUTION) IMPLANT
WATER STERILE IRR 500ML POUR (IV SOLUTION) ×3 IMPLANT

## 2023-08-20 NOTE — Transfer of Care (Signed)
 Immediate Anesthesia Transfer of Care Note  Patient: Leslie Love  Procedure(s) Performed: CYSTOSCOPY, WITH BIOPSY CYSTOSCOPY, WITH RETROGRADE PYELOGRAM (Bilateral) INSTILLATION, BLADDER  Patient Location: PACU  Anesthesia Type:General  Level of Consciousness: awake, drowsy, and patient cooperative  Airway & Oxygen Therapy: Patient Spontanous Breathing and Patient connected to face mask oxygen  Post-op Assessment: Report given to RN and Post -op Vital signs reviewed and stable  Post vital signs: Reviewed and stable  Last Vitals:  Vitals Value Taken Time  BP 109/46 08/20/23 13:31  Temp 36.3 C 08/20/23 13:31  Pulse 67 08/20/23 13:37  Resp 10 08/20/23 13:36  SpO2 100 % 08/20/23 13:37  Vitals shown include unfiled device data.  Last Pain:  Vitals:   08/20/23 1331  TempSrc:   PainSc: Asleep         Complications: No notable events documented.

## 2023-08-20 NOTE — Interval H&P Note (Signed)
 UROLOGY H&P UPDATE  Agree with prior H&P dated 08/10/2023  Cardiac: RRR Lungs: CTA bilaterally  Laterality: bilateral Procedure: Cystoscopy, bilateral retrograde pyelograms, TURBT, gemcitabine   Urine: Culture 7/8 no growth  We discussed transurethral resection of bladder tumor (TURBT) and risks and benefits at length. This is typically a 1 to 2-hour procedure done under general anesthesia in the operating room.  A scope is inserted through the urethra and used to resect abnormal tissue within the bladder, which is then sent to the pathologist to determine grade and stage of the tumor.  Risks include bleeding, infection, need for temporary Foley placement, and bladder perforation.  Treatment strategies are based on the type of tumor and depth of invasion.  We briefly reviewed the different treatment pathways for non-muscle invasive and muscle invasive bladder cancer.   Leslie JAYSON Burnet, MD 08/20/2023

## 2023-08-20 NOTE — Anesthesia Postprocedure Evaluation (Signed)
 Anesthesia Post Note  Patient: Leslie Love  Procedure(s) Performed: CYSTOSCOPY, WITH BIOPSY CYSTOSCOPY, WITH RETROGRADE PYELOGRAM (Bilateral) INSTILLATION, BLADDER  Patient location during evaluation: PACU Anesthesia Type: General Level of consciousness: awake and awake and alert Pain management: satisfactory to patient Respiratory status: spontaneous breathing Cardiovascular status: stable Anesthetic complications: no   No notable events documented.   Last Vitals:  Vitals:   08/20/23 1331 08/20/23 1345  BP: (!) 109/46 (!) 104/56  Pulse: 65 72  Resp: 18 11  Temp: (!) 36.3 C   SpO2: 100% 97%    Last Pain:  Vitals:   08/20/23 1331  TempSrc:   PainSc: Asleep                 VAN STAVEREN,Yazmeen Woolf

## 2023-08-20 NOTE — Op Note (Signed)
 Date of procedure: 08/20/23  Preoperative diagnosis:  Bladder tumor Gross hematuria  Postoperative diagnosis:  Same  Procedure: Cystoscopy, bilateral retrograde pyelograms with intraoperative interpretation Bladder biopsy, 1cm Intravesical instillation of gemcitabine   Surgeon: Redell Burnet, MD  Anesthesia: General  Complications: None  Intraoperative findings:  1 cm papillary bladder tumor right lateral wall Normal retrograde pyelograms bilaterally with no filling defects Uncomplicated bladder biopsy and fulguration, intravesical gemcitabine   EBL: Minimal  Specimens: Bladder tumor  Drains: 18 French Foley  Indication: Leslie Love is a 78 y.o. patient with small bladder tumor on CT.  After reviewing the management options for treatment, they elected to proceed with the above surgical procedure(s). We have discussed the potential benefits and risks of the procedure, side effects of the proposed treatment, the likelihood of the patient achieving the goals of the procedure, and any potential problems that might occur during the procedure or recuperation. Informed consent has been obtained.  Description of procedure:  The patient was taken to the operating room and general anesthesia was induced. SCDs were placed for DVT prophylaxis. The patient was placed in the dorsal lithotomy position, prepped and draped in the usual sterile fashion, and preoperative antibiotics were administered. A preoperative time-out was performed.   A 21 French rigid cystoscope was used to intubate the urethra and thorough cystoscopy was performed.  There was a papillary 1 cm bladder tumor at the right lateral wall on a small stalk.  No other bladder tumors were noted, ureteral orifices were orthotopic bilaterally.  A retrograde pyelogram was performed on the left side which showed no filling defects or hydronephrosis.  Identical procedure was performed on the right side which again showed no  hydronephrosis or filling defects.  Cold cup biopsy forceps were used to remove the papillary tumor in its entirety.  A second smaller bite was taken from the base of the tumor.  Bugbee was used for meticulous hemostasis.  With the bladder decompressed no bleeding was noted  An 69 French Foley was placed with return of clear fluid, 10 mL were placed in the balloon.  Bladder was irrigated with sterile water.  The bladder was drained and 2 g / 50 mL gemcitabine  were instilled into the bladder and the catheter clamped  Disposition: Stable to PACU  Plan: Unclamp Foley at 2:25 PM and allow gemcitabine  to drain.  Foley can then be removed. Will call with pathology results to determine follow-up  Redell Burnet, MD

## 2023-08-20 NOTE — Anesthesia Preprocedure Evaluation (Signed)
 Anesthesia Evaluation  Patient identified by MRN, date of birth, ID band Patient awake    Reviewed: Allergy & Precautions, NPO status , Patient's Chart, lab work & pertinent test results  History of Anesthesia Complications (+) PONV and history of anesthetic complications  Airway Mallampati: II  TM Distance: <3 FB Neck ROM: full    Dental  (+) Teeth Intact   Pulmonary neg pulmonary ROS, former smoker   Pulmonary exam normal breath sounds clear to auscultation       Cardiovascular Exercise Tolerance: Good hypertension, Pt. on medications + CAD  negative cardio ROS Normal cardiovascular exam+ dysrhythmias  Rhythm:Regular Rate:Normal     Neuro/Psych   Anxiety     negative neurological ROS  negative psych ROS   GI/Hepatic negative GI ROS, Neg liver ROS,GERD  ,,  Endo/Other  negative endocrine ROSdiabetes, Type 2, Oral Hypoglycemic Agents    Renal/GU negative Renal ROS  negative genitourinary   Musculoskeletal  (+) Arthritis ,    Abdominal Normal abdominal exam  (+)   Peds negative pediatric ROS (+)  Hematology negative hematology ROS (+) Blood dyscrasia, anemia   Anesthesia Other Findings Past Medical History: No date: Anemia No date: Anxiety     Comment:  a.) uses BZO (alprazolam ) PRN No date: Aortic atherosclerosis (HCC) No date: Bladder tumor No date: CAD (coronary artery disease) No date: Chronic insomnia No date: Complication of anesthesia No date: DDD (degenerative disc disease), lumbar     Comment:  a.) disc desiccation and vacuum phenomen No date: Diverticulosis 12/10/2011: Ductal carcinoma in situ (DCIS) of left breast     Comment:  a.) s/p lumpectomy 12/09/2021 - pathology showed nuclear              grade II DCIS (cribriform subtype with calcifications) No date: GERD (gastroesophageal reflux disease) No date: History of gastritis No date: History of shingles No date: Hyperlipidemia No date:  Hypertension No date: Incomplete RBBB No date: Long-term use of aspirin  therapy (325 mg) No date: Motion sickness No date: Nocturnal leg cramps No date: Osteoarthritis No date: Osteopenia No date: Palpitations No date: Personal history of radiation therapy No date: PONV (postoperative nausea and vomiting) No date: T2DM (type 2 diabetes mellitus) (HCC) No date: Vitamin B 12 deficiency No date: Vitamin D deficiency  Past Surgical History: No date: ABDOMINAL HYSTERECTOMY No date: APPENDECTOMY 1980: AUGMENTATION MAMMAPLASTY; Bilateral     Comment:  removed due to bilateral implant rupture in 02/2016 left               nipple removed 2013: BREAST BIOPSY; Left     Comment:  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 09/16/2018: BREAST BIOPSY; Left     Comment:  stero calcs x clip benign  09/16/2018: BREAST BIOPSY; Left     Comment:  stereo calcs ribbon clip benign  04/10/2016: BREAST IMPLANT REMOVAL; Bilateral     Comment:  Procedure: REMOVAL BILATERAL  BREAST IMPLANTS AND               IMPLANT MATERIAL;  Surgeon: Earlis Ranks, MD;                Location: [facility];  Service: Plastics;               Laterality: Bilateral; 2013: BREAST LUMPECTOMY; Left     Comment:  clear margins No date: BREAST LUMPECTOMY W/ NEEDLE LOCALIZATION; Left No date: BREAST SURGERY 04/10/2016: CAPSULECTOMY; Bilateral     Comment:  Procedure: CAPSULECTOMY;  Surgeon:  Earlis Ranks, MD;              Location: [facility];  Service: Plastics;               Laterality: Bilateral; No date: COLONOSCOPY 12/31/2014: COLONOSCOPY; N/A     Comment:  Procedure: COLONOSCOPY;  Surgeon: Deward CINDERELLA Piedmont, MD;                Location: [facility] END[facility];  Service: Gastroenterology;                Laterality: N/A; 02/04/2016: COLONOSCOPY WITH PROPOFOL ; N/A     Comment:  Procedure: COLONOSCOPY WITH PROPOFOL ;  Surgeon: Gladis RAYMOND Mariner, MD;  Location: [facility] ENDOSCOPY;  Service:               Endoscopy;   Laterality: N/A; No date: ESOPHAGOGASTRODUODENOSCOPY 03/31/2021: ESOPHAGOGASTRODUODENOSCOPY (EGD) WITH PROPOFOL ; N/A     Comment:  Procedure: ESOPHAGOGASTRODUODENOSCOPY (EGD) WITH               PROPOFOL ;  Surgeon: Maryruth Ole DASEN, MD;  Location:               [facility] END[facility];  Service: Endoscopy;  Laterality: N/A; 04/20/2016: INCISION AND DRAINAGE OF WOUND; Left     Comment:  Procedure: IRRIGATION AND DEBRIDEMENT OF LEFT BREAST;                Surgeon: Earlis Ranks, MD;  Location: [facility];  Service: Plastics;  Laterality: Left; No date: KNEE ARTHROSCOPY; Left     Comment:  partial medial and lateral menisceoctomy 08/19/2021: LEFT HEART CATH AND CORONARY ANGIOGRAPHY; N/A     Comment:  Procedure: LEFT HEART CATH AND CORONARY ANGIOGRAPHY;                Surgeon: Ammon Blunt, MD;  Location: [facility]               INVASIVE CV LAB;  Service: Cardiovascular;  Laterality:               N/A; 04/10/2016: MASTOPEXY; Bilateral     Comment:  Procedure: MASTOPEXY;  Surgeon: Earlis Ranks, MD;                Location: [facility];  Service: Plastics;               Laterality: Bilateral;     Reproductive/Obstetrics negative OB ROS                              Anesthesia Physical Anesthesia Plan  ASA: 3  Anesthesia Plan: General   Post-op Pain Management:    Induction:   PONV Risk Score and Plan: Ondansetron , Dexamethasone , Midazolam  and Treatment may vary due to age or medical condition  Airway Management Planned: Oral ETT  Additional Equipment:   Intra-op Plan:   Post-operative Plan:   Informed Consent: I have reviewed the patients History and Physical, chart, labs and discussed the procedure including the risks, benefits and alternatives for the proposed anesthesia with the patient or authorized representative who has indicated his/her understanding and acceptance.     Dental Advisory Given  Plan  Discussed with: CRNA  Anesthesia Plan Comments:         Anesthesia Quick Evaluation

## 2023-08-20 NOTE — Anesthesia Procedure Notes (Signed)
 Procedure Name: Intubation Date/Time: 08/20/2023 1:08 PM  Performed by: Lacretia Camelia NOVAK, CRNAPre-anesthesia Checklist: Patient identified, Emergency Drugs available, Suction available and Patient being monitored Patient Re-evaluated:Patient Re-evaluated prior to induction Oxygen Delivery Method: Circle system utilized Preoxygenation: Pre-oxygenation with 100% oxygen Induction Type: IV induction Ventilation: Mask ventilation without difficulty Laryngoscope Size: McGrath and 3 Grade View: Grade I Tube type: Oral Tube size: 6.5 mm Number of attempts: 1 Airway Equipment and Method: Stylet and Video-laryngoscopy Placement Confirmation: ETT inserted through vocal cords under direct vision, positive ETCO2 and breath sounds checked- equal and bilateral Secured at: 21 cm Tube secured with: Tape Dental Injury: Teeth and Oropharynx as per pre-operative assessment

## 2023-08-21 ENCOUNTER — Encounter: Payer: Self-pay | Admitting: Urology

## 2023-08-23 LAB — SURGICAL PATHOLOGY

## 2023-08-24 ENCOUNTER — Ambulatory Visit: Payer: Self-pay | Admitting: Urology

## 2023-08-24 NOTE — Telephone Encounter (Signed)
Cysto scheduled

## 2023-08-30 ENCOUNTER — Ambulatory Visit: Admitting: Urology

## 2023-09-13 ENCOUNTER — Other Ambulatory Visit: Payer: Self-pay | Admitting: Internal Medicine

## 2023-09-13 DIAGNOSIS — Z1231 Encounter for screening mammogram for malignant neoplasm of breast: Secondary | ICD-10-CM

## 2023-11-24 ENCOUNTER — Ambulatory Visit
Admission: RE | Admit: 2023-11-24 | Discharge: 2023-11-24 | Disposition: A | Discharge status: Home / Self Care | Source: Ambulatory Visit | Attending: Internal Medicine | Admitting: Internal Medicine

## 2023-11-24 DIAGNOSIS — Z1231 Encounter for screening mammogram for malignant neoplasm of breast: Secondary | ICD-10-CM | POA: Insufficient documentation

## 2023-11-30 ENCOUNTER — Ambulatory Visit: Admitting: Urology

## 2023-11-30 VITALS — BP 129/74 | HR 74 | Ht 63.0 in | Wt 171.2 lb

## 2023-11-30 DIAGNOSIS — R310 Gross hematuria: Secondary | ICD-10-CM

## 2023-11-30 DIAGNOSIS — Z8551 Personal history of malignant neoplasm of bladder: Secondary | ICD-10-CM

## 2023-11-30 MED ORDER — CEPHALEXIN 250 MG PO CAPS
500.0000 mg | ORAL_CAPSULE | Freq: Once | ORAL | Status: AC
  Administered 2023-11-30: 500 mg via ORAL

## 2023-11-30 MED ORDER — LIDOCAINE HCL URETHRAL/MUCOSAL 2 % EX GEL
1.0000 | Freq: Once | CUTANEOUS | Status: AC
  Administered 2023-11-30: 1 via URETHRAL

## 2023-11-30 NOTE — Progress Notes (Signed)
 Bladder cancer surveillance note  INDICATION History of NMIBC  UROLOGIC HISTORY  Leslie Love is a 78 y.o. 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 Cystoscopy was performed with the a flexible cystoscope. The urethra, bladder neck and entire bladder was visualized in a standard fashion, and mucosa was grossly normal throughout. The ureteral orifices were visualized in their normal location and orientation.  No abnormalities on retroflexion  Plan: NED Start cranberry tablets daily for UTI prevention, consider topical estrogen cream in the future RTC cystoscopy 9 months, if normal can space to yearly for the next 4 years

## 2024-02-08 IMAGING — CT CT ABD-PELV W/ CM
1 of 3 series · 14 of 32 positions shown, 19 images · IV contrast (agent unspecified)
Comparison: CT abdomen pelvis dated 10/30/2015. chest CT dated
01/15/2015 and 01/17/2016.

CLINICAL DATA: Left lower quadrant abdominal pain.

EXAM:
CT ABDOMEN AND PELVIS WITH CONTRAST
TECHNIQUE: Multidetector CT imaging of the abdomen and pelvis was performed
using the standard protocol following bolus administration of
intravenous contrast.

[Series 2: a/p w/ 5mm · axial · 0.87mm/px · z∈[-462,-27]mm · 14 of 99 slices shown, 19 images]
[im 6/99  soft-tissue]
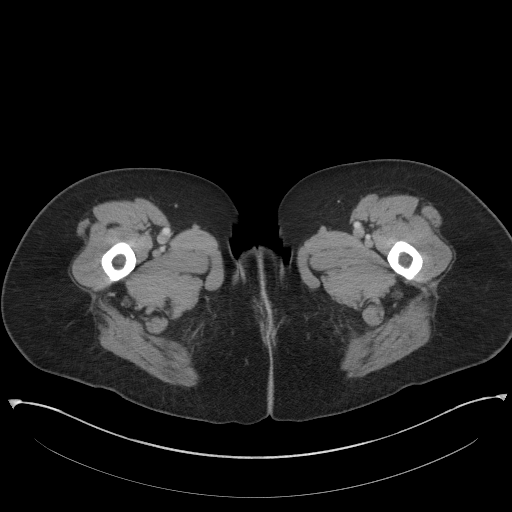
[im 6/99  bone]
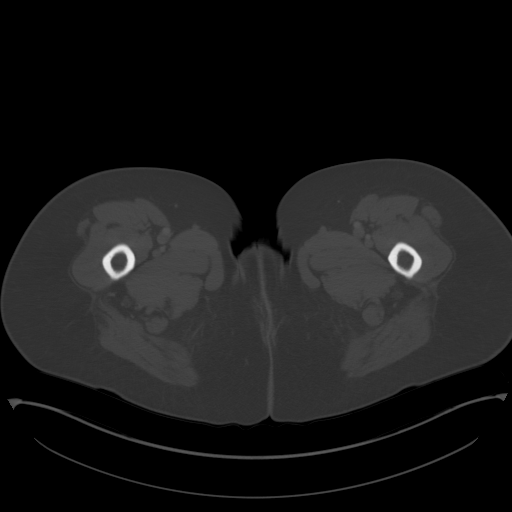
[im 16/99  soft-tissue]
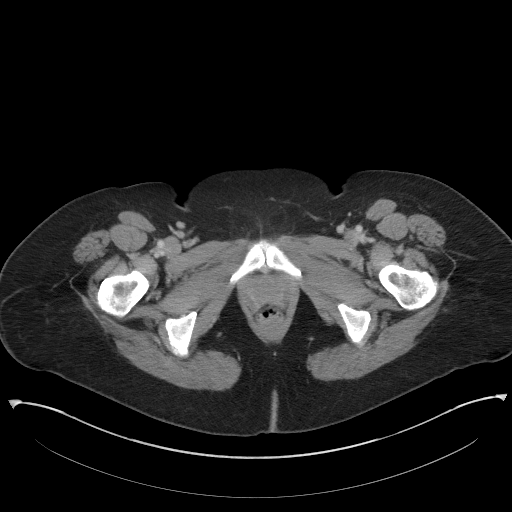
[im 21/99  soft-tissue]
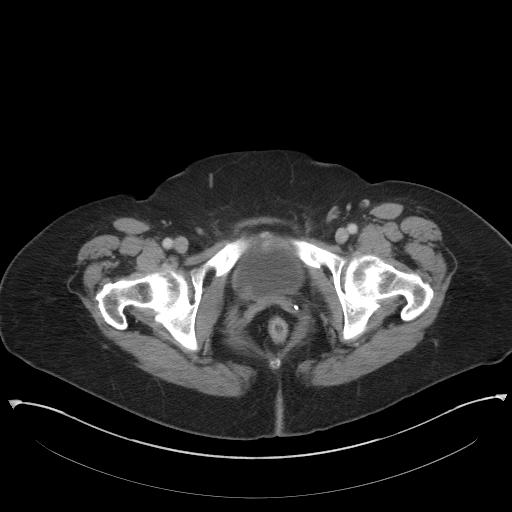
[im 26/99  soft-tissue]
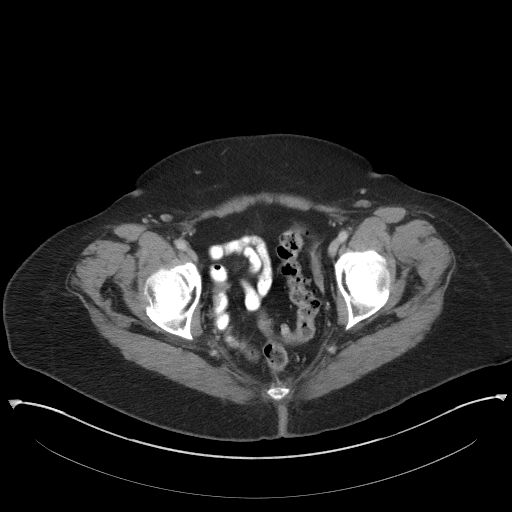
[im 37/99  soft-tissue]
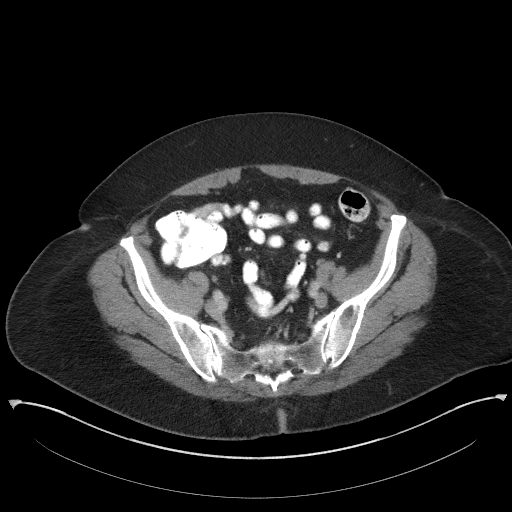
[im 42/99  soft-tissue]
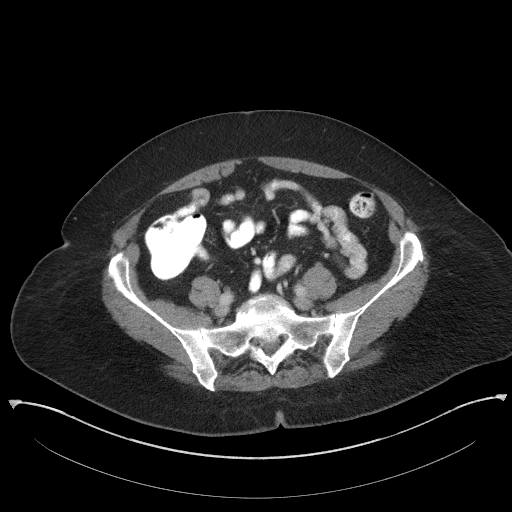
[im 52/99  soft-tissue]
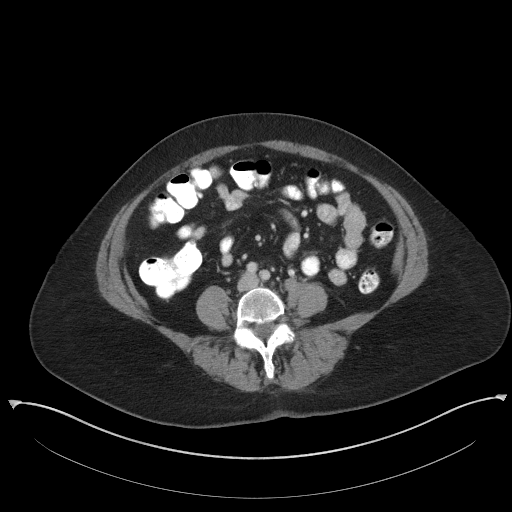
[im 57/99  soft-tissue]
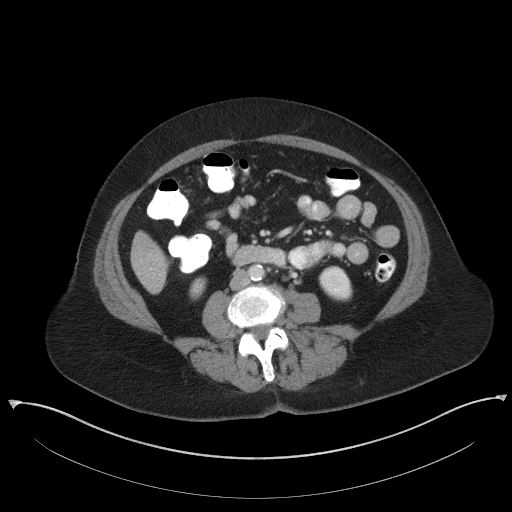
[im 62/99  soft-tissue]
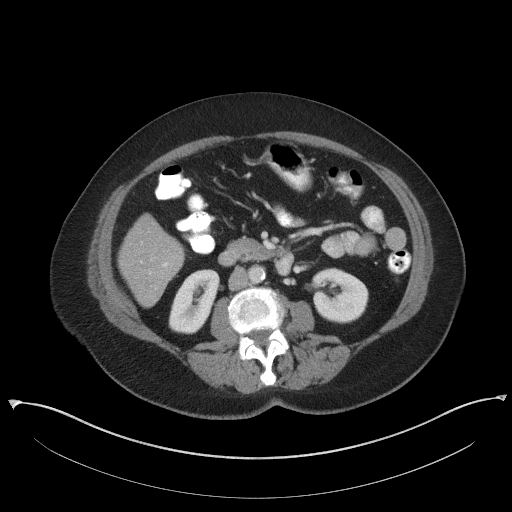
[im 62/99  bone]
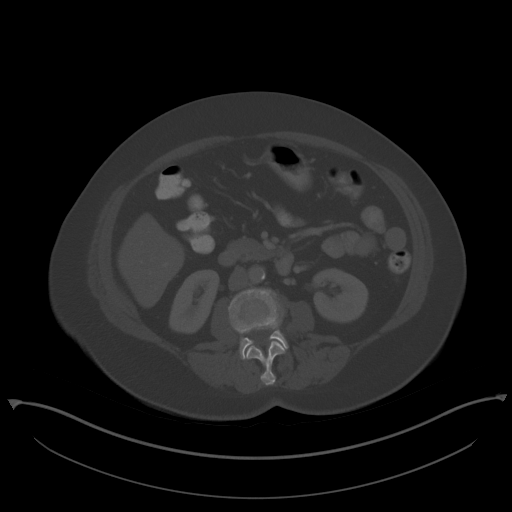
[im 73/99  soft-tissue]
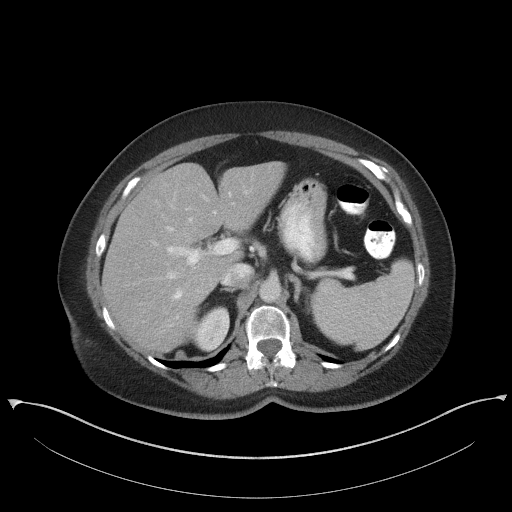
[im 78/99  soft-tissue]
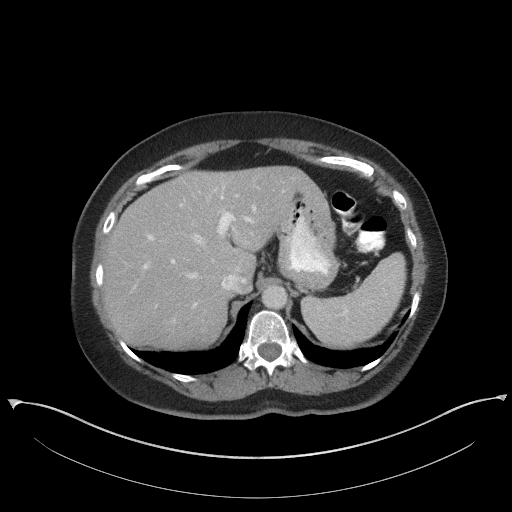
[im 78/99  lung]
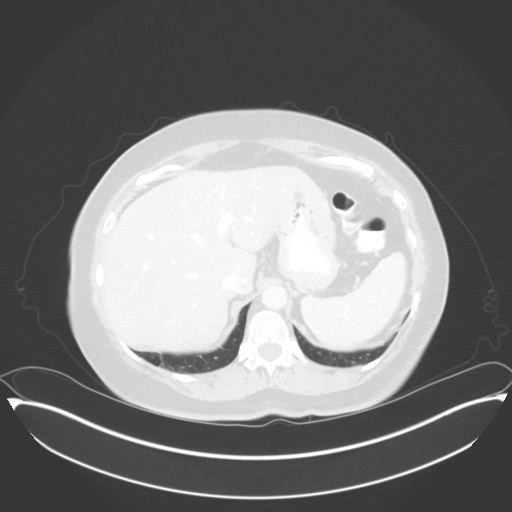
[im 83/99  soft-tissue]
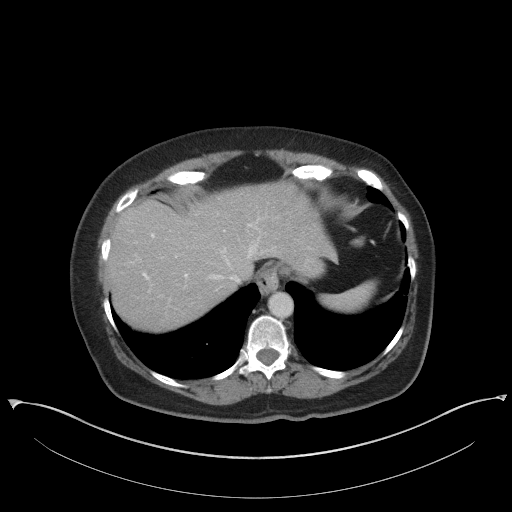
[im 83/99  lung]
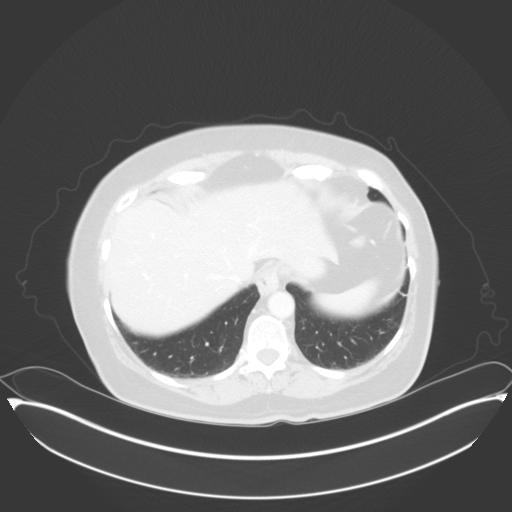
[im 88/99  lung]
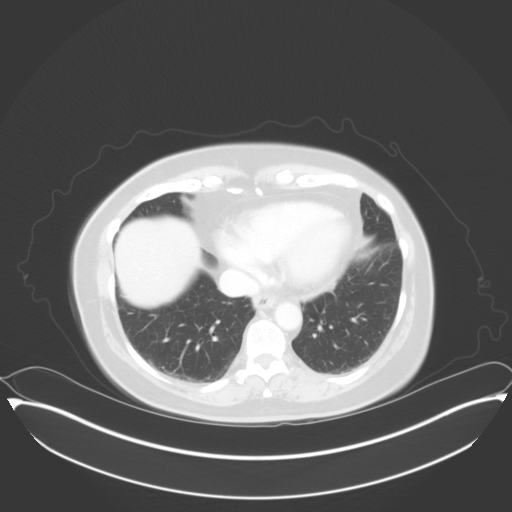
[im 93/99  soft-tissue]
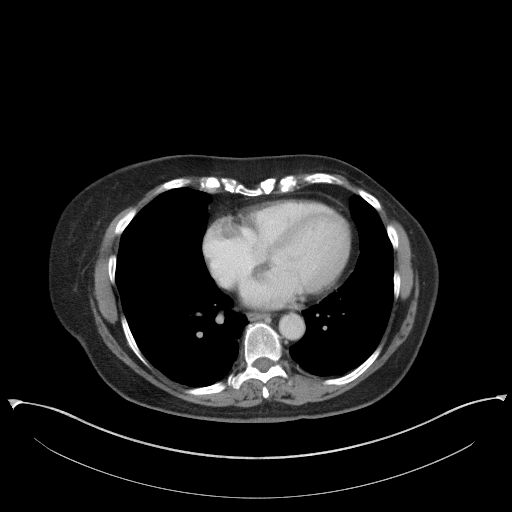
[im 93/99  lung]
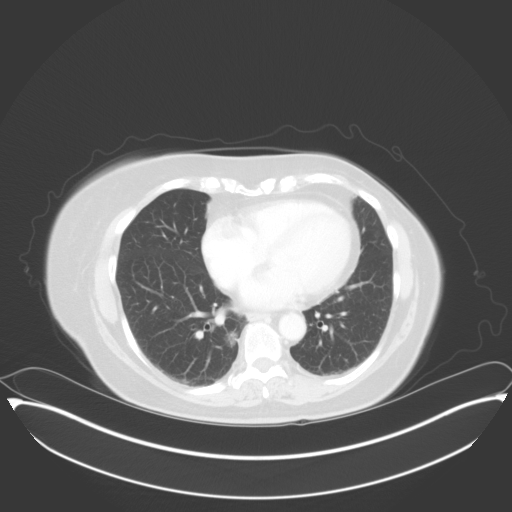

[14 of 32 positions shown; findings below may reference images not displayed]

RADIATION DOSE REDUCTION: This exam was performed according to the
departmental dose-optimization program which includes automated
exposure control, adjustment of the mA and/or kV according to
patient size and/or use of iterative reconstruction technique.

CONTRAST:  100mL 3QGEFR-YJJ IOPAMIDOL (3QGEFR-YJJ) INJECTION 61%
FINDINGS: Lower chest: There is a 1 cm ground-glass nodule at the medial right
lung base similar to chest CT of 01/15/2015. Follow-up known
pulmonary nodules as per recommendation of prior chest CT.

No intra-abdominal free air or free fluid.

Hepatobiliary: Fatty liver. No intrahepatic biliary dilatation. The
gallbladder is unremarkable.

Pancreas: Unremarkable. No pancreatic ductal dilatation or
surrounding inflammatory changes.

Spleen: Normal in size without focal abnormality.

Adrenals/Urinary Tract: The adrenal glands unremarkable. The
kidneys, visualized ureters, and urinary bladder appear
unremarkable.

Stomach/Bowel: There is sigmoid diverticulosis without active
inflammatory changes. There is no bowel obstruction or active
inflammation. The appendix is not visualized with certainty. No
inflammatory changes identified in the right lower quadrant.

Vascular/Lymphatic: Mild aortoiliac atherosclerotic disease. The IVC
is unremarkable. No portal venous gas. There is no adenopathy.

Reproductive: Hysterectomy.  No adnexal masses.

Other: None

Musculoskeletal: Osteopenia with mild degenerative changes of spine.
No acute osseous pathology.
IMPRESSION: 1. No acute intra-abdominal or pelvic pathology.
2. Sigmoid diverticulosis. No bowel obstruction.
3. Fatty liver.
4. Aortic Atherosclerosis ([id number]).

## 2024-08-29 ENCOUNTER — Other Ambulatory Visit: Admitting: Urology
